# Patient Record
Sex: Female | Born: 1955
Health system: Southern US, Community
[De-identification: ages and names within clinical notes are randomized; demographics above are authoritative.]

## PROBLEM LIST (undated history)

## (undated) DIAGNOSIS — E7800 Pure hypercholesterolemia, unspecified: Secondary | ICD-10-CM

## (undated) DIAGNOSIS — I10 Essential (primary) hypertension: Secondary | ICD-10-CM

## (undated) DIAGNOSIS — T7840XA Allergy, unspecified, initial encounter: Secondary | ICD-10-CM

## (undated) DIAGNOSIS — J302 Other seasonal allergic rhinitis: Secondary | ICD-10-CM

## (undated) DIAGNOSIS — L309 Dermatitis, unspecified: Secondary | ICD-10-CM

## (undated) HISTORY — PX: INNER EAR SURGERY: SHX679

## (undated) HISTORY — PX: NO PAST SURGERIES: SHX2092

## (undated) HISTORY — DX: Pure hypercholesterolemia, unspecified: E78.00

## (undated) HISTORY — DX: Allergy, unspecified, initial encounter: T78.40XA

## (undated) HISTORY — DX: Essential (primary) hypertension: I10

## (undated) HISTORY — DX: Dermatitis, unspecified: L30.9

## (undated) HISTORY — DX: Other seasonal allergic rhinitis: J30.2

---

## 2015-02-01 ENCOUNTER — Ambulatory Visit (INDEPENDENT_AMBULATORY_CARE_PROVIDER_SITE_OTHER): Payer: 59 | Admitting: Nurse Practitioner

## 2015-02-01 ENCOUNTER — Telehealth: Payer: Self-pay | Admitting: Nurse Practitioner

## 2015-02-01 ENCOUNTER — Encounter: Payer: Self-pay | Admitting: Nurse Practitioner

## 2015-02-01 VITALS — BP 137/82 | HR 72 | Temp 98.0°F | Ht 59.0 in | Wt 157.0 lb

## 2015-02-01 DIAGNOSIS — I10 Essential (primary) hypertension: Secondary | ICD-10-CM

## 2015-02-01 MED ORDER — LISINOPRIL-HYDROCHLOROTHIAZIDE 10-12.5 MG PO TABS: 1.0000 | ORAL_TABLET | Freq: Every day | ORAL | Status: DC

## 2015-02-01 NOTE — Patient Instructions (Signed)
Continue blood pressure medicine. Blood pressure goal is under 120/80. Weight loss will lower blood pressure.  Weight goal is 120 lbs. Start walking 30 minutes every day. Stop eating sugar-no candy, cookies, cake, sweet tea, juice, soda. Eat fresh fruit instead.  Please mail lab results. See me in 3 months to check weight & blood pressure. You should be able to lose at least 10 pounds when I see you again. Also, you are due for Pap smear & tetanus vaccine.   Very nice to meet you!

## 2015-02-01 NOTE — Telephone Encounter (Signed)
emmi emailed °

## 2015-02-01 NOTE — Progress Notes (Signed)
Pre visit review using our clinic review tool, if applicable. No additional management support is needed unless otherwise documented below in the visit note. 

## 2015-02-05 ENCOUNTER — Encounter: Payer: Self-pay | Admitting: Nurse Practitioner

## 2015-02-05 NOTE — Progress Notes (Signed)
Subjective:    Gloria [name] is a 59 y.o. female who presents to establish care & discuss elevated blood pressure. She is from [place] She is currently treated with lisinopril/HCTZ 10/12.5 mg qd. She is tolerating w/out SE. Cardiac symptoms: none. Patient denies: chest pain, irregular heart beat and lower extremity edema. Cardiovascular risk factors: hypertension, obesity (BMI >= 30 kg/m2) and sedentary lifestyle. Use of agents associated with hypertension: none. History of target organ damage: none. She recently had "[facility]". She will mail results.  The following portions of the patient's history were reviewed and updated as appropriate: allergies, current medications, past family history, past medical history, past social history, past surgical history and problem list.  Review of Systems Pertinent items are noted in HPI.   Objective:    BP 137/82 mmHg  Pulse 72  Temp(Src) 98 F (36.7 C) (Temporal)  Ht 4\' 11"  (1.499 m)  Wt 157 lb (71.215 kg)  BMI 31.69 kg/m2  SpO2 98% General appearance: alert, cooperative, appears stated age and no distress Head: Normocephalic, without obvious abnormality, atraumatic Eyes: negative findings: lids and lashes normal and conjunctivae and sclerae normal Neck: no adenopathy, no carotid bruit, supple, symmetrical, trachea midline and thyroid not enlarged, symmetric, no tenderness/mass/nodules Lungs: clear to auscultation bilaterally Heart: regular rate and rhythm, S1, S2 normal, no murmur, click, rub or gallop Neurologic: Grossly normal    Assessment:Plan     1. Essential hypertension, benign Continue current med Fair control Encourage weight loss. Activity & diet changes recommended - lisinopril-hydrochlorothiazide (PRINZIDE,ZESTORETIC) 10-12.5 MG per tablet; Take 1 tablet by mouth daily. TAKE ONE TABLET BY MOUTH ONE TIME DAILY  Dispense: 90 tablet; Refill: 1 [facility] results to be mailed. F/u 3 mos-needs pap

## 2015-05-01 ENCOUNTER — Encounter: Payer: Self-pay | Admitting: Nurse Practitioner

## 2015-05-01 DIAGNOSIS — Z0000 Encounter for general adult medical examination without abnormal findings: Secondary | ICD-10-CM | POA: Insufficient documentation

## 2015-05-03 ENCOUNTER — Ambulatory Visit: Payer: 59 | Admitting: Nurse Practitioner

## 2015-05-03 ENCOUNTER — Encounter: Payer: Self-pay | Admitting: Nurse Practitioner

## 2015-05-03 ENCOUNTER — Ambulatory Visit (INDEPENDENT_AMBULATORY_CARE_PROVIDER_SITE_OTHER): Payer: 59 | Admitting: Nurse Practitioner

## 2015-05-03 VITALS — BP 126/78 | HR 74 | Temp 98.0°F | Resp 16 | Ht 59.0 in | Wt 156.0 lb

## 2015-05-03 DIAGNOSIS — E669 Obesity, unspecified: Secondary | ICD-10-CM | POA: Insufficient documentation

## 2015-05-03 DIAGNOSIS — Z6831 Body mass index (BMI) 31.0-31.9, adult: Secondary | ICD-10-CM

## 2015-05-03 DIAGNOSIS — I10 Essential (primary) hypertension: Secondary | ICD-10-CM | POA: Insufficient documentation

## 2015-05-03 DIAGNOSIS — E559 Vitamin D deficiency, unspecified: Secondary | ICD-10-CM | POA: Insufficient documentation

## 2015-05-03 DIAGNOSIS — Z114 Encounter for screening for human immunodeficiency virus [HIV]: Secondary | ICD-10-CM | POA: Diagnosis not present

## 2015-05-03 NOTE — Progress Notes (Signed)
Subjective:    Patient here for follow-up of elevated blood pressure.  She is exercising and is not adherent to a low-salt diet.  Blood pressure is well controlled at home. Cardiac symptoms: none. Patient denies: chest pressure/discomfort, irregular heart beat and lower extremity edema. Cardiovascular risk factors: hypertension and obesity (BMI >= 30 kg/m2). Use of agents associated with hypertension: none. History of target organ damage: none.  Reviewed preventive care: needs MMG & pap. Pt refuses today. Plan on returning for labs-she will mail labs from "[facility]".   The following portions of the patient's history were reviewed and updated as appropriate: allergies, current medications, past medical history, past social history, past surgical history and problem list.  Review of Systems Pertinent items are noted in HPI.     Objective:    BP 126/78 mmHg  Pulse 74  Temp(Src) 98 F (36.7 C) (Temporal)  Resp 16  Ht 4\' 11"  (1.499 m)  Wt 156 lb (70.761 kg)  BMI 31.49 kg/m2  SpO2 95% General appearance: alert, cooperative, appears stated age and no distress Eyes: negative findings: lids and lashes normal and conjunctivae and sclerae normal Lungs: clear to auscultation bilaterally Heart: regular rate and rhythm, S1, S2 normal, no murmur, click, rub or gallop Neurologic: Grossly normal    Assessment:Plan  1. BMI 31.0-31.9,adult Increase exercise to 5 times week. Eliminate soda. - Lipid panel; Future - Hemoglobin A1c; Future - TSH; Future  2. Vitamin D deficiency - Vit D  25 hydroxy (rtn osteoporosis monitoring); Future  3. Essential hypertension Stable Continue current meds - CBC with Differential/Platelet; Future - Comprehensive metabolic panel; Future - Microalbumin / creatinine urine ratio; Future  4. Screening for HIV (human immunodeficiency virus) - HIV antibody; Future  F/u 2 wks for fasting labs, needs pap & mmg

## 2015-05-03 NOTE — Progress Notes (Signed)
Pre visit review using our clinic review tool, if applicable. No additional management support is needed unless otherwise documented below in the visit note. 

## 2015-05-03 NOTE — Patient Instructions (Signed)
Please return for fasting labs-no breakfast. Coff is OK if no milk or sugar.  Please get mammogram and return for pap smear.  Eliminate soda. Increase Exercise to 5 days week for best health.

## 2015-05-15 ENCOUNTER — Telehealth: Payer: Self-pay | Admitting: Nurse Practitioner

## 2015-05-15 ENCOUNTER — Encounter: Payer: Self-pay | Admitting: Family Medicine

## 2015-05-15 NOTE — Telephone Encounter (Signed)
pls call pt: Advise I reviewed tests she had through [facility] screening. I recommend she come in for labs-all orders placed. She may come at her convenience & should fast.

## 2015-05-15 NOTE — Telephone Encounter (Signed)
Pt's phone number is d/c.   Sent unable to contact letter to patient.

## 2015-05-18 ENCOUNTER — Telehealth: Payer: Self-pay | Admitting: Nurse Practitioner

## 2015-05-18 NOTE — Telephone Encounter (Signed)
Patient's phone # has been updated. Patient's husband does not want to pay for patient to have bloodwork. He will call back if they change their mind.

## 2015-08-17 ENCOUNTER — Telehealth: Payer: Self-pay | Admitting: Nurse Practitioner

## 2015-08-17 NOTE — Telephone Encounter (Signed)
Pt states her B/P is low now and she wants to know if she still needs to continue her B/P meds? Please call at [phone]

## 2015-08-17 NOTE — Telephone Encounter (Signed)
Spoke to patient.  She was to f/u in July and never did so.  Pt agreed to schedule appointment and will be seen by Dr. Raoul Pitch 08/20/15.

## 2015-08-20 ENCOUNTER — Encounter: Payer: Self-pay | Admitting: Family Medicine

## 2015-08-20 ENCOUNTER — Ambulatory Visit (INDEPENDENT_AMBULATORY_CARE_PROVIDER_SITE_OTHER): Payer: Managed Care, Other (non HMO) | Admitting: Family Medicine

## 2015-08-20 VITALS — BP 156/85 | HR 62 | Temp 97.9°F | Wt 158.8 lb

## 2015-08-20 DIAGNOSIS — I10 Essential (primary) hypertension: Secondary | ICD-10-CM | POA: Diagnosis not present

## 2015-08-20 MED ORDER — LISINOPRIL-HYDROCHLOROTHIAZIDE 10-12.5 MG PO TABS: 1.0000 | ORAL_TABLET | Freq: Every day | ORAL | Status: DC

## 2015-08-20 NOTE — Progress Notes (Signed)
   Subjective:    Patient ID: Gloria [name], female    DOB: Dec 17, 1955, 59 y.o.   MRN: [id number]  HPI  Hypertension: Patient presents for acute office visit today to follow-up on her hypertension. She states that she has been checking her blood pressure at work and at the [facility] and has noticed "low blood pressures "that she states are in the 389H systolic and 73S to 70 diastolic. She denies any dizziness, chest pain, lower extremity edema, syncope, visual changes or headache. Patient walks every day, watches the salt content in her diet. She does take a daily baby aspirin. No cholesterol screening on file, patient states she gets these through her employment screenings. She reports they have been normal.  Past Medical History  Diagnosis Date  . Seasonal allergies   . Hypertension   . High cholesterol   . Allergy    No Known Allergies History reviewed. No pertinent past surgical history. Social History   Social History  . Marital Status: Married    Spouse Name: N/A  . Number of Children: 1  . Years of Education: 4 yr [place]   Occupational History  . 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 Smoking status: Never Smoker   . Smokeless tobacco: Not on file  . Alcohol Use: No  . Drug Use: No  . Sexual Activity: Yes    Birth Control/ Protection: Post-menopausal   Other Topics Concern  . Not on file   Social History Narrative   Ms Ganci is married. Recently moved from [place]. She lives with husband & adopted daughter. She works FT at [employer].     Review of Systems Negative, with the exception of above mentioned in HPI     Objective:   Physical Exam BP 156/85 mmHg  Pulse 62  Temp(Src) 97.9 F (36.6 C) (Oral)  Wt 158 lb 12.8 oz (72.031 kg)  SpO2 98% Gen: Afebrile. No acute distress. Nontoxic in appearance, well-developed, well-nourished female. Pleasant. HENT: AT. [place].  MMM.  Eyes:Pupils Equal Round Reactive to light, Extraocular movements  intact,  Conjunctiva without redness, discharge or icterus. CV: RRR no murmur appreciated, no edema, +2/4 P posterior tibialis pulses Chest: CTAB, no wheeze or crackles.      Assessment & Plan:  1. Essential hypertension, benign - Discussed normal blood pressure with patient today, advised her to continue monitoring her blood pressure in the outpatient setting, however normal blood pressure would be anything between 105 - 139 and below 90 diastolic. Patient encouraged to exercise daily, to continue watching salt content in her diet. Patient educated on signs of hypertension. She is to continue monitoring her blood pressures, if she notices consistent abnormal results she is to follow-up sooner, otherwise she can follow-up in 4 months. - lisinopril-hydrochlorothiazide (PRINZIDE,ZESTORETIC) 10-12.5 MG tablet; Take 1 tablet by mouth daily. TAKE ONE TABLET BY MOUTH ONE TIME DAILY  Dispense: 90 tablet; Refill: 1  - Patient encouraged to obtain records of her laboratory results and her immunization records from her employment. So that her health maintenance can be monitored more efficiently.

## 2015-08-20 NOTE — Patient Instructions (Addendum)
Hypertension Hypertension, commonly called high blood pressure, is when the force of blood pumping through your arteries is too strong. Your arteries are the blood vessels that carry blood from your heart throughout your body. A blood pressure reading consists of a higher number over a lower number, such as 110/72. The higher number (systolic) is the pressure inside your arteries when your heart pumps. The lower number (diastolic) is the pressure inside your arteries when your heart relaxes. Ideally you want your blood pressure below 120/80. Hypertension forces your heart to work harder to pump blood. Your arteries may become narrow or stiff. Having untreated or uncontrolled hypertension can cause heart attack, stroke, kidney disease, and other problems. RISK FACTORS Some risk factors for high blood pressure are controllable. Others are not.  Risk factors you cannot control include:   Race. You may be at higher risk if you are African American.  Age. Risk increases with age.  Gender. Men are at higher risk than women before age 45 years. After age 65, women are at higher risk than men. Risk factors you can control include:  Not getting enough exercise or physical activity.  Being overweight.  Getting too much fat, sugar, calories, or salt in your diet.  Drinking too much alcohol. SIGNS AND SYMPTOMS Hypertension does not usually cause signs or symptoms. Extremely high blood pressure (hypertensive crisis) may cause headache, anxiety, shortness of breath, and nosebleed. DIAGNOSIS To check if you have hypertension, your health care provider will measure your blood pressure while you are seated, with your arm held at the level of your heart. It should be measured at least twice using the same arm. Certain conditions can cause a difference in blood pressure between your right and left arms. A blood pressure reading that is higher than normal on one occasion does not mean that you need treatment. If  it is not clear whether you have high blood pressure, you may be asked to return on a different day to have your blood pressure checked again. Or, you may be asked to monitor your blood pressure at home for 1 or more weeks. TREATMENT Treating high blood pressure includes making lifestyle changes and possibly taking medicine. Living a healthy lifestyle can help lower high blood pressure. You may need to change some of your habits. Lifestyle changes may include:  Following the DASH diet. This diet is high in fruits, vegetables, and whole grains. It is low in salt, red meat, and added sugars.  Keep your sodium intake below 2,300 mg per day.  Getting at least 30-45 minutes of aerobic exercise at least 4 times per week.  Losing weight if necessary.  Not smoking.  Limiting alcoholic beverages.  Learning ways to reduce stress. Your health care provider may prescribe medicine if lifestyle changes are not enough to get your blood pressure under control, and if one of the following is true:  You are 18-59 years of age and your systolic blood pressure is above 140.  You are 60 years of age or older, and your systolic blood pressure is above 150.  Your diastolic blood pressure is above 90.  You have diabetes, and your systolic blood pressure is over 140 or your diastolic blood pressure is over 90.  You have kidney disease and your blood pressure is above 140/90.  You have heart disease and your blood pressure is above 140/90. Your personal target blood pressure may vary depending on your medical conditions, your age, and other factors. HOME CARE INSTRUCTIONS    Have your blood pressure rechecked as directed by your health care provider.   Take medicines only as directed by your health care provider. Follow the directions carefully. Blood pressure medicines must be taken as prescribed. The medicine does not work as well when you skip doses. Skipping doses also puts you at risk for  problems.  Do not smoke.   Monitor your blood pressure at home as directed by your health care provider. SEEK MEDICAL CARE IF:   You think you are having a reaction to medicines taken.  You have recurrent headaches or feel dizzy.  You have swelling in your ankles.  You have trouble with your vision. SEEK IMMEDIATE MEDICAL CARE IF:  You develop a severe headache or confusion.  You have unusual weakness, numbness, or feel faint.  You have severe chest or abdominal pain.  You vomit repeatedly.  You have trouble breathing. MAKE SURE YOU:   Understand these instructions.  Will watch your condition.  Will get help right away if you are not doing well or get worse.   This information is not intended to replace advice given to you by your health care provider. Make sure you discuss any questions you have with your health care provider.   Document Released: 10/20/2005 Document Revised: 03/06/2015 Document Reviewed: 08/12/2013 Elsevier Interactive Patient Education 2016 [place] Blood pressure medicine.  Normal Blood pressure for you should be between 105-140 on the top and lower than 90 on the bottom.  Continue to watch the salt in your diet and exercise.  It was a pleasure meeting you.  Please get [place] the records of your immunizations and labs.

## 2015-12-14 ENCOUNTER — Encounter: Payer: Self-pay | Admitting: Family Medicine

## 2015-12-14 ENCOUNTER — Ambulatory Visit (INDEPENDENT_AMBULATORY_CARE_PROVIDER_SITE_OTHER): Payer: Managed Care, Other (non HMO) | Admitting: Family Medicine

## 2015-12-14 VITALS — BP 120/80 | HR 61 | Temp 97.8°F | Resp 20 | Wt 155.8 lb

## 2015-12-14 DIAGNOSIS — I10 Essential (primary) hypertension: Secondary | ICD-10-CM

## 2015-12-14 DIAGNOSIS — Z1239 Encounter for other screening for malignant neoplasm of breast: Secondary | ICD-10-CM | POA: Insufficient documentation

## 2015-12-14 DIAGNOSIS — Z6831 Body mass index (BMI) 31.0-31.9, adult: Secondary | ICD-10-CM | POA: Diagnosis not present

## 2015-12-14 LAB — COMPREHENSIVE METABOLIC PANEL
ALT: 20 U/L (ref 6–29)
AST: 21 U/L (ref 10–35)
Albumin: 4.2 g/dL (ref 3.6–5.1)
Alkaline Phosphatase: 50 U/L (ref 33–130)
BUN: 12 mg/dL (ref 7–25)
CO2: 31 mmol/L (ref 20–31)
Calcium: 9.4 mg/dL (ref 8.6–10.4)
Chloride: 99 mmol/L (ref 98–110)
Creat: 0.63 mg/dL (ref 0.50–0.99)
GLUCOSE: 93 mg/dL (ref 65–99)
POTASSIUM: 4.7 mmol/L (ref 3.5–5.3)
Sodium: 135 mmol/L (ref 135–146)
Total Bilirubin: 0.5 mg/dL (ref 0.2–1.2)
Total Protein: 7 g/dL (ref 6.1–8.1)

## 2015-12-14 LAB — CBC WITH DIFFERENTIAL/PLATELET
BASOS ABS: 0.1 10*3/uL (ref 0.0–0.1)
Basophils Relative: 1 % (ref 0–1)
EOS ABS: 0.6 10*3/uL (ref 0.0–0.7)
EOS PCT: 9 % — AB (ref 0–5)
HCT: 38.6 % (ref 36.0–46.0)
Hemoglobin: 12.7 g/dL (ref 12.0–15.0)
Lymphocytes Relative: 27 % (ref 12–46)
Lymphs Abs: 1.9 10*3/uL (ref 0.7–4.0)
MCH: 29 pg (ref 26.0–34.0)
MCHC: 32.9 g/dL (ref 30.0–36.0)
MCV: 88.1 fL (ref 78.0–100.0)
MPV: 10.1 fL (ref 8.6–12.4)
Monocytes Absolute: 0.4 10*3/uL (ref 0.1–1.0)
Monocytes Relative: 6 % (ref 3–12)
Neutro Abs: 4 10*3/uL (ref 1.7–7.7)
Neutrophils Relative %: 57 % (ref 43–77)
PLATELETS: 269 10*3/uL (ref 150–400)
RBC: 4.38 MIL/uL (ref 3.87–5.11)
RDW: 13.1 % (ref 11.5–15.5)
WBC: 7.1 10*3/uL (ref 4.0–10.5)

## 2015-12-14 LAB — TSH: TSH: 1.19 mIU/L

## 2015-12-14 MED ORDER — LISINOPRIL-HYDROCHLOROTHIAZIDE 10-12.5 MG PO TABS: 1.0000 | ORAL_TABLET | Freq: Every day | ORAL | Status: DC

## 2015-12-14 NOTE — Patient Instructions (Signed)
Hypertension Hypertension, commonly called high blood pressure, is when the force of blood pumping through your arteries is too strong. Your arteries are the blood vessels that carry blood from your heart throughout your body. A blood pressure reading consists of a higher number over a lower number, such as 110/72. The higher number (systolic) is the pressure inside your arteries when your heart pumps. The lower number (diastolic) is the pressure inside your arteries when your heart relaxes. Ideally you want your blood pressure below 120/80. Hypertension forces your heart to work harder to pump blood. Your arteries may become narrow or stiff. Having untreated or uncontrolled hypertension can cause heart attack, stroke, kidney disease, and other problems. RISK FACTORS Some risk factors for high blood pressure are controllable. Others are not.  Risk factors you cannot control include:   Race. You may be at higher risk if you are African American.  Age. Risk increases with age.  Gender. Men are at higher risk than women before age 59 years. After age 96, women are at higher risk than men. Risk factors you can control include:  Not getting enough exercise or physical activity.  Being overweight.  Getting too much fat, sugar, calories, or salt in your diet.  Drinking too much alcohol. SIGNS AND SYMPTOMS Hypertension does not usually cause signs or symptoms. Extremely high blood pressure (hypertensive crisis) may cause headache, anxiety, shortness of breath, and nosebleed. DIAGNOSIS To check if you have hypertension, your health care provider will measure your blood pressure while you are seated, with your arm held at the level of your heart. It should be measured at least twice using the same arm. Certain conditions can cause a difference in blood pressure between your right and left arms. A blood pressure reading that is higher than normal on one occasion does not mean that you need treatment. If  it is not clear whether you have high blood pressure, you may be asked to return on a different day to have your blood pressure checked again. Or, you may be asked to monitor your blood pressure at home for 1 or more weeks. TREATMENT Treating high blood pressure includes making lifestyle changes and possibly taking medicine. Living a healthy lifestyle can help lower high blood pressure. You may need to change some of your habits. Lifestyle changes may include:  Following the DASH diet. This diet is high in fruits, vegetables, and whole grains. It is low in salt, red meat, and added sugars.  Keep your sodium intake below 2,300 mg per day.  Getting at least 30-45 minutes of aerobic exercise at least 4 times per week.  Losing weight if necessary.  Not smoking.  Limiting alcoholic beverages.  Learning ways to reduce stress. Your health care provider may prescribe medicine if lifestyle changes are not enough to get your blood pressure under control, and if one of the following is true:  You are 17-110 years of age and your systolic blood pressure is above 140.  You are 90 years of age or older, and your systolic blood pressure is above 150.  Your diastolic blood pressure is above 90.  You have diabetes, and your systolic blood pressure is over 160 or your diastolic blood pressure is over 90.  You have kidney disease and your blood pressure is above 140/90.  You have heart disease and your blood pressure is above 140/90. Your personal target blood pressure may vary depending on your medical conditions, your age, and other factors. HOME CARE INSTRUCTIONS  Have your blood pressure rechecked as directed by your health care provider.   Take medicines only as directed by your health care provider. Follow the directions carefully. Blood pressure medicines must be taken as prescribed. The medicine does not work as well when you skip doses. Skipping doses also puts you at risk for  problems.  Do not smoke.   Monitor your blood pressure at home as directed by your health care provider. SEEK MEDICAL CARE IF:   You think you are having a reaction to medicines taken.  You have recurrent headaches or feel dizzy.  You have swelling in your ankles.  You have trouble with your vision. SEEK IMMEDIATE MEDICAL CARE IF:  You develop a severe headache or confusion.  You have unusual weakness, numbness, or feel faint.  You have severe chest or abdominal pain.  You vomit repeatedly.  You have trouble breathing. MAKE SURE YOU:   Understand these instructions.  Will watch your condition.  Will get help right away if you are not doing well or get worse.   This information is not intended to replace advice given to you by your health care provider. Make sure you discuss any questions you have with your health care provider.   Document Released: 10/20/2005 Document Revised: 03/06/2015 Document Reviewed: 08/12/2013 Elsevier Interactive Patient Education 2016 [place] Maintenance, Female Adopting a healthy lifestyle and getting preventive care can go a long way to promote health and wellness. Talk with your health care provider about what schedule of regular examinations is right for you. This is a good chance for you to check in with your provider about disease prevention and staying healthy. In between checkups, there are plenty of things you can do on your own. Experts have done a lot of research about which lifestyle changes and preventive measures are most likely to keep you healthy. Ask your health care provider for more information. WEIGHT AND DIET  Eat a healthy diet  Be sure to include plenty of vegetables, fruits, low-fat dairy products, and lean protein.  Do not eat a lot of foods high in solid fats, added sugars, or salt.  Get regular exercise. This is one of the most important things you can do for your health.  Most adults should  exercise for at least 150 minutes each week. The exercise should increase your heart rate and make you sweat (moderate-intensity exercise).  Most adults should also do strengthening exercises at least twice a week. This is in addition to the moderate-intensity exercise.  Maintain a healthy weight  Body mass index (BMI) is a measurement that can be used to identify possible weight problems. It estimates body fat based on height and weight. Your health care provider can help determine your BMI and help you achieve or maintain a healthy weight.  For females 45 years of age and older:   A BMI below 18.5 is considered underweight.  A BMI of 18.5 to 24.9 is normal.  A BMI of 25 to 29.9 is considered overweight.  A BMI of 30 and above is considered obese.  Watch levels of cholesterol and blood lipids  You should start having your blood tested for lipids and cholesterol at 61 years of age, then have this test every 5 years.  You may need to have your cholesterol levels checked more often if:  Your lipid or cholesterol levels are high.  You are older than 61 years of age.  You are at high risk for heart  disease.  CANCER SCREENING   Lung Cancer  Lung cancer screening is recommended for adults 72-29 years old who are at high risk for lung cancer because of a history of smoking.  A yearly low-dose CT scan of the lungs is recommended for people who:  Currently smoke.  Have quit within the past 15 years.  Have at least a 30-pack-year history of smoking. A pack year is smoking an average of one pack of cigarettes a day for 1 year.  Yearly screening should continue until it has been 15 years since you quit.  Yearly screening should stop if you develop a health problem that would prevent you from having lung cancer treatment.  Breast Cancer  Practice breast self-awareness. This means understanding how your breasts normally appear and feel.  It also means doing regular breast  self-exams. Let your health care provider know about any changes, no matter how small.  If you are in your 20s or 30s, you should have a clinical breast exam (CBE) by a health care provider every 1-3 years as part of a regular health exam.  If you are 32 or older, have a CBE every year. Also consider having a breast X-ray (mammogram) every year.  If you have a family history of breast cancer, talk to your health care provider about genetic screening.  If you are at high risk for breast cancer, talk to your health care provider about having an MRI and a mammogram every year.  Breast cancer gene (BRCA) assessment is recommended for women who have family members with BRCA-related cancers. BRCA-related cancers include:  Breast.  Ovarian.  Tubal.  Peritoneal cancers.  Results of the assessment will determine the need for genetic counseling and BRCA1 and BRCA2 testing. Cervical Cancer Your health care provider may recommend that you be screened regularly for cancer of the pelvic organs (ovaries, uterus, and vagina). This screening involves a pelvic examination, including checking for microscopic changes to the surface of your cervix (Pap test). You may be encouraged to have this screening done every 3 years, beginning at age 98.  For women ages 71-65, health care providers may recommend pelvic exams and Pap testing every 3 years, or they may recommend the Pap and pelvic exam, combined with testing for human papilloma virus (HPV), every 5 years. Some types of HPV increase your risk of cervical cancer. Testing for HPV may also be done on women of any age with unclear Pap test results.  Other health care providers may not recommend any screening for nonpregnant women who are considered low risk for pelvic cancer and who do not have symptoms. Ask your health care provider if a screening pelvic exam is right for you.  If you have had past treatment for cervical cancer or a condition that could lead  to cancer, you need Pap tests and screening for cancer for at least 20 years after your treatment. If Pap tests have been discontinued, your risk factors (such as having a new sexual partner) need to be reassessed to determine if screening should resume. Some women have medical problems that increase the chance of getting cervical cancer. In these cases, your health care provider may recommend more frequent screening and Pap tests. Colorectal Cancer  This type of cancer can be detected and often prevented.  Routine colorectal cancer screening usually begins at 61 years of age and continues through 61 years of age.  Your health care provider may recommend screening at an earlier age if you have  risk factors for colon cancer.  Your health care provider may also recommend using home test kits to check for hidden blood in the stool.  A small camera at the end of a tube can be used to examine your colon directly (sigmoidoscopy or colonoscopy). This is done to check for the earliest forms of colorectal cancer.  Routine screening usually begins at age 34.  Direct examination of the colon should be repeated every 5-10 years through 61 years of age. However, you may need to be screened more often if early forms of precancerous polyps or small growths are found. Skin Cancer  Check your skin from head to toe regularly.  Tell your health care provider about any new moles or changes in moles, especially if there is a change in a mole's shape or color.  Also tell your health care provider if you have a mole that is larger than the size of a pencil eraser.  Always use sunscreen. Apply sunscreen liberally and repeatedly throughout the day.  Protect yourself by wearing long sleeves, pants, a wide-brimmed hat, and sunglasses whenever you are outside. HEART DISEASE, DIABETES, AND HIGH BLOOD PRESSURE   High blood pressure causes heart disease and increases the risk of stroke. High blood pressure is more  likely to develop in:  People who have blood pressure in the high end of the normal range (130-139/85-89 mm Hg).  People who are overweight or obese.  People who are African American.  If you are 86-61 years of age, have your blood pressure checked every 3-5 years. If you are 66 years of age or older, have your blood pressure checked every year. You should have your blood pressure measured twice--once when you are at a hospital or clinic, and once when you are not at a hospital or clinic. Record the average of the two measurements. To check your blood pressure when you are not at a hospital or clinic, you can use:  An automated blood pressure machine at a pharmacy.  A home blood pressure monitor.  If you are between 34 years and 65 years old, ask your health care provider if you should take aspirin to prevent strokes.  Have regular diabetes screenings. This involves taking a blood sample to check your fasting blood sugar level.  If you are at a normal weight and have a low risk for diabetes, have this test once every three years after 61 years of age.  If you are overweight and have a high risk for diabetes, consider being tested at a younger age or more often. PREVENTING INFECTION  Hepatitis B  If you have a higher risk for hepatitis B, you should be screened for this virus. You are considered at high risk for hepatitis B if:  You were born in a country where hepatitis B is common. Ask your health care provider which countries are considered high risk.  Your parents were born in a high-risk country, and you have not been immunized against hepatitis B (hepatitis B vaccine).  You have HIV or AIDS.  You use needles to inject street drugs.  You live with someone who has hepatitis B.  You have had sex with someone who has hepatitis B.  You get hemodialysis treatment.  You take certain medicines for conditions, including cancer, organ transplantation, and autoimmune  conditions. Hepatitis C  Blood testing is recommended for:  Everyone born from [birth year] through 1966.  Anyone with known risk factors for hepatitis C. Sexually transmitted infections (STIs)  You should be screened for sexually transmitted infections (STIs) including gonorrhea and chlamydia if:  You are sexually active and are younger than 61 years of age.  You are older than 61 years of age and your health care provider tells you that you are at risk for this type of infection.  Your sexual activity has changed since you were last screened and you are at an increased risk for chlamydia or gonorrhea. Ask your health care provider if you are at risk.  If you do not have HIV, but are at risk, it may be recommended that you take a prescription medicine daily to prevent HIV infection. This is called pre-exposure prophylaxis (PrEP). You are considered at risk if:  You are sexually active and do not regularly use condoms or know the HIV status of your partner(s).  You take drugs by injection.  You are sexually active with a partner who has HIV. Talk with your health care provider about whether you are at high risk of being infected with HIV. If you choose to begin PrEP, you should first be tested for HIV. You should then be tested every 3 months for as long as you are taking PrEP.  PREGNANCY   If you are premenopausal and you may become pregnant, ask your health care provider about preconception counseling.  If you may become pregnant, take 400 to 800 micrograms (mcg) of folic acid every day.  If you want to prevent pregnancy, talk to your health care provider about birth control (contraception). OSTEOPOROSIS AND MENOPAUSE   Osteoporosis is a disease in which the bones lose minerals and strength with aging. This can result in serious bone fractures. Your risk for osteoporosis can be identified using a bone density scan.  If you are 72 years of age or older, or if you are at risk for  osteoporosis and fractures, ask your health care provider if you should be screened.  Ask your health care provider whether you should take a calcium or vitamin D supplement to lower your risk for osteoporosis.  Menopause may have certain physical symptoms and risks.  Hormone replacement therapy may reduce some of these symptoms and risks. Talk to your health care provider about whether hormone replacement therapy is right for you.  HOME CARE INSTRUCTIONS   Schedule regular health, dental, and eye exams.  Stay current with your immunizations.   Do not use any tobacco products including cigarettes, chewing tobacco, or electronic cigarettes.  If you are pregnant, do not drink alcohol.  If you are breastfeeding, limit how much and how often you drink alcohol.  Limit alcohol intake to no more than 1 drink per day for nonpregnant women. One drink equals 12 ounces of beer, 5 ounces of wine, or 1 ounces of hard liquor.  Do not use street drugs.  Do not share needles.  Ask your health care provider for help if you need support or information about quitting drugs.  Tell your health care provider if you often feel depressed.  Tell your health care provider if you have ever been abused or do not feel safe at home.   This information is not intended to replace advice given to you by your health care provider. Make sure you discuss any questions you have with your health care provider.   Document Released: 05/05/2011 Document Revised: 11/10/2014 Document Reviewed: 09/21/2013 Elsevier Interactive Patient Education [employer].

## 2015-12-14 NOTE — Progress Notes (Signed)
Patient ID: Gloria [name], female   DOB: 1956/02/22, 60 y.o.   MRN: [id number]   Subjective:    Patient ID: Gloria [name], female    DOB: 07/30/1956, 60 y.o.   MRN: [id number]  HPI  Hypertension: Patient presents for routine follow-up  today for hypertension. She denies any dizziness, chest pain, lower extremity edema, syncope, visual changes or headache. She reports compliance with lisinopril/HCTZ 10-12.5. She states she did forget to take her medication today. She is needing refills soon. She tries to eat a low-salt diet. She does not exercise routinely. She states she works at term, this makes it difficult to find time to exercise. She states her blood pressures have been within range in the outpatient setting.  Healthcare maintenance: Prior visits patient has repeatedly stated that she has had lab work and immunizations through her employer. She has never provided records, despite being asked multiple occasions. Patient has continued to decline colon cancer screening. She is amendable to scheduling her mammogram, which will be due in March. He follows with gynecology for Pap smears/pelvic.  Past Medical History  Diagnosis Date  . Seasonal allergies   . Hypertension   . High cholesterol   . Allergy    No Known Allergies Past Surgical History  Procedure Laterality Date  . No past surgeries     Social History   Social History  . Marital Status: Married    Spouse Name: N/A  . Number of Children: 1  . Years of Education: 4 yr [place]   Occupational History  . 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 Smoking status: Never Smoker   . Smokeless tobacco: Never Used  . Alcohol Use: No  . Drug Use: No  . Sexual Activity: Yes    Birth Control/ Protection: Post-menopausal   Other Topics Concern  . Not on file   Social History Narrative   Ms Rigdon is married.  She lives with husband & adopted daughter. She works FT at [employer].   Smoke detector in  the home, wears her seatbelt.        Review of Systems Negative, with the exception of above mentioned in HPI     Objective:   Physical Exam BP 120/80 mmHg  Pulse 61  Temp(Src) 97.8 F (36.6 C) (Oral)  Resp 20  Wt 155 lb 12 oz (70.648 kg)  SpO2 98% Gen: Afebrile. No acute distress. Nontoxic in appearance, well-developed, well-nourished female. Pleasant. Quiet HENT: AT. [place].  MMM.  Eyes:Pupils Equal Round Reactive to light, Extraocular movements intact,  Conjunctiva without redness, discharge or icterus. CV: RRR no murmur appreciated, no edema, +2/4 P posterior tibialis pulses Chest: CTAB, no wheeze or crackles.  Abdomen: Soft, nontender, nondistended, obese. No masses palpated. No hepatosplenomegaly. Neuro: Normal gait. Alert, oriented 3, [place], EOMI.     Assessment & Plan:   Essential hypertension, benign - Stable, continue current regimen, refills today - Continue low salt diet, attempt to exercise greater than 150 minutes a week. - lisinopril-hydrochlorothiazide (PRINZIDE,ZESTORETIC) 10-12.5 MG tablet; Take 1 tablet by mouth daily. TAKE ONE TABLET BY MOUTH ONE TIME DAILY  Dispense: 90 tablet; Refill: 1 - Continue daily aspirin - CBC w/Diff - Comp Met (CMET) - Lipid panel; Future--> patient to return for lab appointment only for fasting lipid panel.  BMI 31.0-31.9,adult - HgB A1c - TSH - Encourage greater then 150 minutes of exercise and dietary modifications.  Breast cancer screening - [facility] Digital  Screening; Future  Six-month follow-up on hypertension

## 2015-12-15 LAB — HEMOGLOBIN A1C
Hgb A1c MFr Bld: 6.1 % — ABNORMAL HIGH (ref <5.7)
Mean Plasma Glucose: 128 mg/dL — ABNORMAL HIGH (ref <117)

## 2015-12-17 ENCOUNTER — Telehealth: Payer: Self-pay | Admitting: Family Medicine

## 2015-12-17 NOTE — Telephone Encounter (Signed)
Left message for patient to return call to review labs and instructions. 

## 2015-12-17 NOTE — Telephone Encounter (Signed)
Please call pt: - her lab results indicate prediabetic range with an A1c of 6.1 (glucose 128). She will need repeat a1c with appt in 3 months, a1c prior to rooming  - Please also remind her to have her lipids drawn fasting. This is a future order since she was not fasting that morning.  - all other lab work looks normal.

## 2016-01-18 ENCOUNTER — Ambulatory Visit (INDEPENDENT_AMBULATORY_CARE_PROVIDER_SITE_OTHER): Payer: Managed Care, Other (non HMO) | Admitting: Family Medicine

## 2016-01-18 ENCOUNTER — Encounter: Payer: Self-pay | Admitting: Family Medicine

## 2016-01-18 VITALS — BP 129/80 | HR 68 | Temp 98.0°F | Resp 20 | Wt 154.8 lb

## 2016-01-18 DIAGNOSIS — L309 Dermatitis, unspecified: Secondary | ICD-10-CM

## 2016-01-18 DIAGNOSIS — N61 Mastitis without abscess: Secondary | ICD-10-CM | POA: Diagnosis not present

## 2016-01-18 HISTORY — DX: Dermatitis, unspecified: L30.9

## 2016-01-18 MED ORDER — CLOTRIMAZOLE 1 % EX CREA: TOPICAL_CREAM | CUTANEOUS | Status: DC

## 2016-01-18 NOTE — Patient Instructions (Signed)
I will call in a cream for you to use on the area.   We need to get a mammogram within a week for diagnosis. If mammogram  is normal, I will still need to follow close and we will likely need to refer you to a specialist.  Changes like this can be breast cancer, so we need to make certain to be follow through with work up.

## 2016-01-18 NOTE — Progress Notes (Signed)
Patient ID: Gloria [name], female   DOB: 1956-07-18, 60 y.o.   MRN: [id number]    Gloria [name] , [birth year], 60 y.o., female MRN: [id number]  CC: rt breast abrasion Subjective: Pt presents for an acute OV with complaints of Rt breast/nipple abrasion of unknown duration "awhile", and worsening/spreading area causing her concern for infection. Patient states her husband told her to make an appointment because it is getting worse. She denies any trauma to the area that she is aware.She does admit to discoloration, thicker skin, dry/flaky area that itches. She has been putting a moisturizer lotion over the area for the last few weeks. She denies any other areas of skin irritation, changes in soap, detergents, new bra etc.   Patient denies drainage, redness, fever or chills nipple discharge or breast mass. She denies family history of breast cancer. She is a prediabetic and currently not on medications for diabetes.  She is postmenopausal.     No Known Allergies Social History  Substance Use Topics  . Smoking status: Never Smoker   . Smokeless tobacco: Never Used  . Alcohol Use: No   Past Medical History  Diagnosis Date  . Seasonal allergies   . Hypertension   . High cholesterol   . Allergy    Past Surgical History  Procedure Laterality Date  . No past surgeries     Family History  Problem Relation Age of Onset  . Heart disease Father   . Alcohol abuse Brother   . Kidney disease Brother      Medication List       This list is accurate as of: 01/18/16 11:29 AM.  Always use your most recent med list.               aspirin 81 MG chewable tablet  Chew 81 mg by mouth. Reported on 12/14/2015     lisinopril-hydrochlorothiazide 10-12.5 MG tablet  Commonly known as:  PRINZIDE,ZESTORETIC  Take 1 tablet by mouth daily. TAKE ONE TABLET BY MOUTH ONE TIME DAILY       ROS: Negative, with the exception of above mentioned in HPI  Objective:  BP 129/80 mmHg  Pulse 68  Temp(Src) 98 F (36.7  C)  Resp 20  Wt 154 lb 12 oz (70.194 kg)  SpO2 97% Body mass index is 31.24 kg/(m^2). Gen: Afebrile. No acute distress. Nontoxic in appearance, well developed, well nourished female. pleasant HENT: AT. [place].MMM, no oral lesions.  Eyes:Pupils Equal Round Reactive to light, Extraocular movements intact,  Conjunctiva without redness, discharge or icterus. Neck/lymph: Supple, no lymphadenopathy Skin: no (other than below) rashes, purpura or petechiae.  Breasts: left breast appears normal, right breast 2.5x 3 cm lateral areolar lesion, scaly, hyperpigmented, mild erythema at 9 o'clock position of areola. No tenderness on exam, no suspicious masses, no other skin changes, no nipple drainage.  Neuro:  PERLA. EOMi. Alert. Oriented x3   Assessment/Plan: Gloria [name] is a 60 y.o. female present for acute OV for  Nipple dermatitis/Nipple inflammation - Prediabetic, will attempt treatment with antifungal while waiting on Diag mammogram to be completed. Pt has been known not to follow through with some evaluations in the past, discussed with her in the detail this could be something as simple as a fungal infection vs a type of breast cancer.  - clotrimazole (CLOTRIMAZOLE AF) 1 % cream; Apply to affected area BID.  Dispense: 30 g; Refill: 0 - MM Digital Diagnostic Bilat; Future - consider surgical referral for biopsy after mammogram, topical treatment.  -  F/U 1 week   electronically signed by:  Howard Pouch, DO  [place]

## 2016-05-21 ENCOUNTER — Other Ambulatory Visit: Payer: Self-pay | Admitting: Obstetrics & Gynecology

## 2016-05-21 DIAGNOSIS — Z1231 Encounter for screening mammogram for malignant neoplasm of breast: Secondary | ICD-10-CM

## 2016-05-23 ENCOUNTER — Ambulatory Visit
Admission: RE | Admit: 2016-05-23 | Discharge: 2016-05-23 | Disposition: A | Discharge status: Home / Self Care | Payer: Managed Care, Other (non HMO) | Source: Ambulatory Visit | Attending: Obstetrics & Gynecology | Admitting: Obstetrics & Gynecology

## 2016-05-23 DIAGNOSIS — Z1231 Encounter for screening mammogram for malignant neoplasm of breast: Secondary | ICD-10-CM

## 2016-06-12 ENCOUNTER — Ambulatory Visit: Payer: Managed Care, Other (non HMO) | Admitting: Family Medicine

## 2016-06-23 ENCOUNTER — Ambulatory Visit (INDEPENDENT_AMBULATORY_CARE_PROVIDER_SITE_OTHER): Payer: Managed Care, Other (non HMO) | Admitting: Family Medicine

## 2016-06-23 ENCOUNTER — Encounter: Payer: Self-pay | Admitting: Family Medicine

## 2016-06-23 VITALS — BP 121/79 | HR 63 | Temp 97.9°F | Resp 20 | Ht 59.0 in | Wt 153.8 lb

## 2016-06-23 DIAGNOSIS — I10 Essential (primary) hypertension: Secondary | ICD-10-CM | POA: Diagnosis not present

## 2016-06-23 DIAGNOSIS — R7303 Prediabetes: Secondary | ICD-10-CM | POA: Insufficient documentation

## 2016-06-23 DIAGNOSIS — Z6831 Body mass index (BMI) 31.0-31.9, adult: Secondary | ICD-10-CM

## 2016-06-23 LAB — POCT GLYCOSYLATED HEMOGLOBIN (HGB A1C): Hemoglobin A1C: 5.7

## 2016-06-23 MED ORDER — LISINOPRIL-HYDROCHLOROTHIAZIDE 10-12.5 MG PO TABS: 1.0000 | ORAL_TABLET | Freq: Every day | ORAL | 1 refills | Status: DC

## 2016-06-23 NOTE — Progress Notes (Signed)
Patient ID: Gloria [name], female   DOB: [birth year], 60 y.o.   MRN: [id number]   Subjective:    Patient ID: Gloria [name], female    DOB: 02-06-1956, 60 y.o.   MRN: [id number]  HPI  Hypertension: Patient presents for routine follow-up  today for hypertension. She denies any dizziness, chest pain, lower extremity edema, syncope, visual changes or headache. She reports compliance with lisinopril/HCTZ 10-12.5. She states compliance with  Medication now.  She tries to eat a low-salt diet. She walking/exercise routinely. She states her blood pressures have been within range in the outpatient setting. She forgets sometimes to take meds, but remembers most days. She takes a baby ASA sometimes.   Prediabetes: last a1c 6.1 12/2015. She has not made many dietary changes, but continues to walk daily. She denies numbness or tingling in her ext or non-healing wounds. She does eat white rice for many meals, since most of her asian  dishes have rice involved.    Past Medical History:  Diagnosis Date  . Allergy   . High cholesterol   . Hypertension   . Seasonal allergies    No Known Allergies Past Surgical History:  Procedure Laterality Date  . NO PAST SURGERIES     Social History   Social History  . Marital status: Married    Spouse name: N/A  . Number of children: 1  . Years of education: 4 yr [place]   Occupational History  . 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 Smoking status: Never Smoker  . Smokeless tobacco: Never Used  . Alcohol use No  . Drug use: No  . Sexual activity: Yes    Birth control/ protection: Post-menopausal   Other Topics Concern  . Not on file   Social History Narrative   Gloria [name] is married.  She lives with husband & adopted daughter. She works FT at [employer].   Smoke detector in the home, wears her seatbelt.        Review of Systems Negative, with the exception of above mentioned in HPI     Objective:   Physical  Exam BP 121/79 (BP Location: Right Arm, Patient Position: Sitting, Cuff Size: Large)   Pulse 63   Temp 97.9 F (36.6 C) (Oral)   Resp 20   Ht 4\' 11"  (1.499 m)   Wt 153 lb 12.8 oz (69.8 kg)   SpO2 95%   BMI 31.06 kg/m  Gen: Afebrile. No acute distress. Nontoxic in appearance, well-developed, well-nourished female. Pleasant.  HENT: AT. [place].  MMM.  Eyes:Pupils Equal Round Reactive to light, Extraocular movements intact,  Conjunctiva without redness, discharge or icterus. CV: RRR no murmur appreciated, no edema, +2/4 P posterior tibialis pulses Chest: CTAB, no wheeze or crackles.  Abdomen: Soft, nontender, nondistended, obese. No masses palpated. No hepatosplenomegaly. Neuro: Normal gait. Alert, oriented 3, [place], EOMI.     Assessment & Plan:   Essential hypertension, benign - Stable, continue current regimen, refills today - Continue low salt diet, continue exercise greater than 150 minutes a week. - lisinopril-hydrochlorothiazide (PRINZIDE,ZESTORETIC) 10-12.5 MG tablet; Take 1 tablet by mouth daily. TAKE ONE TABLET BY MOUTH ONE TIME DAILY  Dispense: 90 tablet; Refill: 1 - Continue daily aspirin - f/u every 6 mos.   Prediabetes/BMI > 30:  - Last a1c 6.1 --> rpt today 5.7. - Diet and exercise modifications discussed. Diabetes development discussed,as well as normal and abnormal ranges of A1c.  - F/U  6 months with hypertension f/u.   Electronically Signed by: Howard Pouch, DO [facility] [place]

## 2016-06-23 NOTE — Patient Instructions (Signed)
Prediabetes Eating Plan Prediabetes--also called impaired glucose tolerance or impaired fasting glucose--is a condition that causes blood sugar (blood glucose) levels to be higher than normal. Following a healthy diet can help to keep prediabetes under control. It can also help to lower the risk of type 2 diabetes and heart disease, which are increased in people who have prediabetes. Along with regular exercise, a healthy diet:  Promotes weight loss.  Helps to control blood sugar levels.  Helps to improve the way that the body uses insulin. WHAT DO I NEED TO KNOW ABOUT THIS EATING PLAN?  Use the glycemic index (GI) to plan your meals. The index tells you how quickly a food will raise your blood sugar. Choose low-GI foods. These foods take a longer time to raise blood sugar.  Pay close attention to the amount of carbohydrates in the food that you eat. Carbohydrates increase blood sugar levels.  Keep track of how many calories you take in. Eating the right amount of calories will help you to achieve a healthy weight. Losing about 7 percent of your starting weight can help to prevent type 2 diabetes.  You may want to follow a Mediterranean diet. This diet includes a lot of vegetables, lean meats or fish, whole grains, fruits, and healthy oils and fats. WHAT FOODS CAN I EAT? Grains Whole grains, such as whole-wheat or whole-grain breads, crackers, cereals, and pasta. Unsweetened oatmeal. Bulgur. Barley. Quinoa. Brown rice. Corn or whole-wheat flour tortillas or taco shells. Vegetables Lettuce. Spinach. Peas. Beets. Cauliflower. Cabbage. Broccoli. Carrots. Tomatoes. Squash. Eggplant. Herbs. Peppers. Onions. Cucumbers. Brussels sprouts. Fruits Berries. Bananas. Apples. Oranges. Grapes. Papaya. Mango. Pomegranate. Kiwi. Grapefruit. Cherries. Meats and Other Protein Sources Seafood. Lean meats, such as chicken and turkey or lean cuts of pork and beef. Tofu. Eggs. Nuts. Beans. Dairy Low-fat or  fat-free dairy products, such as yogurt, cottage cheese, and cheese. Beverages Water. Tea. Coffee. Sugar-free or diet soda. Seltzer water. Milk. Milk alternatives, such as soy or almond milk. Condiments Mustard. Relish. Low-fat, low-sugar ketchup. Low-fat, low-sugar barbecue sauce. Low-fat or fat-free mayonnaise. Sweets and Desserts Sugar-free or low-fat pudding. Sugar-free or low-fat ice cream and other frozen treats. Fats and Oils Avocado. Walnuts. Olive oil. The items listed above may not be a complete list of recommended foods or beverages. Contact your dietitian for more options.  WHAT FOODS ARE NOT RECOMMENDED? Grains Refined white flour and flour products, such as bread, pasta, snack foods, and cereals. Beverages Sweetened drinks, such as sweet iced tea and soda. Sweets and Desserts Baked goods, such as cake, cupcakes, pastries, cookies, and cheesecake. The items listed above may not be a complete list of foods and beverages to avoid. Contact your dietitian for more information.   This information is not intended to replace advice given to you by your health care provider. Make sure you discuss any questions you have with your health care provider.   Document Released: 03/06/2015 Document Reviewed: 03/06/2015 Elsevier Interactive Patient Education 2016 Elsevier Inc.  

## 2016-06-24 ENCOUNTER — Ambulatory Visit: Payer: Managed Care, Other (non HMO) | Admitting: Family Medicine

## 2016-12-19 ENCOUNTER — Ambulatory Visit (INDEPENDENT_AMBULATORY_CARE_PROVIDER_SITE_OTHER): Payer: BLUE CROSS/BLUE SHIELD | Admitting: Family Medicine

## 2016-12-19 ENCOUNTER — Encounter: Payer: Self-pay | Admitting: Family Medicine

## 2016-12-19 VITALS — BP 172/90 | HR 98 | Temp 98.3°F | Resp 20 | Wt 158.5 lb

## 2016-12-19 DIAGNOSIS — I10 Essential (primary) hypertension: Secondary | ICD-10-CM

## 2016-12-19 DIAGNOSIS — M25512 Pain in left shoulder: Secondary | ICD-10-CM | POA: Diagnosis not present

## 2016-12-19 DIAGNOSIS — R7303 Prediabetes: Secondary | ICD-10-CM | POA: Diagnosis not present

## 2016-12-19 LAB — POCT GLYCOSYLATED HEMOGLOBIN (HGB A1C): HEMOGLOBIN A1C: 5.9

## 2016-12-19 MED ORDER — LISINOPRIL-HYDROCHLOROTHIAZIDE 10-12.5 MG PO TABS: 1.0000 | ORAL_TABLET | Freq: Every day | ORAL | 1 refills | Status: DC

## 2016-12-19 NOTE — Patient Instructions (Signed)
Take medicine  EVERYDAY, even when fasting.  Your a1c today is ok at 5.9, but still a little elevated, keep watching your diet and exercise to help keep blood pressure controlled and blood sugar normal. Watch the sugar and carbohydrate (rice) consumption, decreasing this can make your blood sugars much better. Eat low sodium diet.  Exercise > 150 minutes a week.  Follow up every 6 months.

## 2016-12-19 NOTE — Progress Notes (Signed)
Patient ID: Gloria [name], female   DOB: [birth year], 61 y.o.   MRN: [id number]   Subjective:    Patient ID: Gloria [name], female    DOB: January 29, 1956, 61 y.o.   MRN: [id number]  Hypertension     Hypertension: Pt reports compliance with Lisinopril/HCTZ 10-12.5 mg daily.  Blood pressures ranges at home 1:30/80. Patient denies chest pain, shortness of breath or lower extremity edema. Pt takes a daily baby ASA. Pt is not prescribed statin. She does not exercise routinely. She does not watch her diet closely. She has not taken her medications today because she "fast every Friday ". She states she also received some bad news on the way into her appointment today. Her BMP has been normal.    Prediabetes: Patient has a history of elevated A1c. A1c trend 6.1--> 5.7-->5.9 today. She has not made any changes to her diet or exercise plan. Last office visit we discussed her cutting back on the white rice for the majority of her meals. She has not done this as of yet.  Left arm pain:  she has a new complaint today of left arm pain of 6 weeks' duration. He points to her left shoulder as location of discomfort. She states sometimes cause an achiness down her arm mostly with sleeping. He denies any numbness or tingling. She denies any injury. She states when she gets up and starts working or moving around the pain resolves. She's been using hot compresses and over-the-counter creams, which seemed to have some benefit for her. She has no known arthritis in other joints. She states she can move her arm around without difficulty. She denies any weakness.   Past Medical History:  Diagnosis Date  . Allergy   . High cholesterol   . Hypertension   . Seasonal allergies    No Known Allergies Past Surgical History:  Procedure Laterality Date  . NO PAST SURGERIES     Social History   Social History  . Marital status: Married    Spouse name: N/A  . Number of children: 1  . Years of education: 4 yr [place]    Occupational History  . 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 Smoking status: Never Smoker  . Smokeless tobacco: Never Used  . Alcohol use No  . Drug use: No  . Sexual activity: Yes    Birth control/ protection: Post-menopausal   Other Topics Concern  . Not on file   Social History Narrative   [name] is married.  She lives with husband & adopted daughter. She works FT at [employer].   Smoke detector in the home, wears her seatbelt.        Review of Systems Negative, with the exception of above mentioned in HPI     Objective:   Physical Exam BP (!) 172/90 (BP Location: Right Arm, Patient Position: Sitting, Cuff Size: Normal)   Pulse 98   Temp 98.3 F (36.8 C)   Resp 20   Wt 158 lb 8 oz (71.9 kg)   SpO2 98%   BMI 32.01 kg/m  Gen: Afebrile. No acute distress. Nontoxic in appearance, well-developed, well-nourished Asian female. HENT: AT. [place]. MMM.  Eyes:Pupils Equal Round Reactive to light, Extraocular movements intact,  Conjunctiva without redness, discharge or icterus. CV: RRR, no edema, +2/4 P posterior tibialis pulses Chest: CTAB, no wheeze or crackles Abd: Soft. NTND. BS present.  MSK: No erythema, no soft tissue swelling, no  tenderness to palpation left shoulder. Full range of motion. Neurovascular intact distally. Skin: no rashes, purpura or petechiae.  Neuro: Normal gait. PERLA. EOMi. Alert. Oriented.     Assessment & Plan:  Gloria [name] is a 61 y.o. female present for follow up on chronic medical conditions.   Essential hypertension, benign - Extremely elevated today. Patient is asymptomatic. She has not taken any of her blood pressure medications today because she states she is fasting. His custom with her that elicits for religious purposes, she should take her blood pressure medicine every day even when fasting. She is to go home and monitor her blood pressure after taking her medications, if elevated above 140/90  she needs to be seen immediately to discuss her medications. - Restart low-salt diet. Start exercise regimen, greater than 150 minutes a week. - Refills on lisinopril/HCTZ 10-12 0.5 mg daily provided today. -Continue baby aspirin daily. - Follow-up in 6 months, with BMP.   Prediabetes/BMI > 30:  -  A1c today 5.9. Discussed with her she needs to follow the dietary recommendations and increase her exercise in order to improve her A1c.  - continue to monitor every 6 months    Arthritis/left shoulder:  - Chest with patient this sounds like arthritic pain in her shoulder, improves with activity. Discussed creams, capsaicin, daily low-dose anti-inflammatory or Tylenol.   Electronically Signed by: Howard Pouch, DO [facility] [place]

## 2017-04-24 ENCOUNTER — Ambulatory Visit (HOSPITAL_BASED_OUTPATIENT_CLINIC_OR_DEPARTMENT_OTHER)
Admission: RE | Admit: 2017-04-24 | Discharge: 2017-04-24 | Disposition: A | Discharge status: Home / Self Care | Payer: BLUE CROSS/BLUE SHIELD | Source: Ambulatory Visit | Attending: Family Medicine | Admitting: Family Medicine

## 2017-04-24 ENCOUNTER — Ambulatory Visit (INDEPENDENT_AMBULATORY_CARE_PROVIDER_SITE_OTHER): Payer: BLUE CROSS/BLUE SHIELD | Admitting: Family Medicine

## 2017-04-24 ENCOUNTER — Encounter: Payer: Self-pay | Admitting: Family Medicine

## 2017-04-24 ENCOUNTER — Telehealth: Payer: Self-pay | Admitting: Family Medicine

## 2017-04-24 VITALS — BP 139/89 | HR 69 | Temp 98.5°F | Resp 20 | Wt 157.8 lb

## 2017-04-24 DIAGNOSIS — M1711 Unilateral primary osteoarthritis, right knee: Secondary | ICD-10-CM | POA: Insufficient documentation

## 2017-04-24 DIAGNOSIS — S8991XA Unspecified injury of right lower leg, initial encounter: Secondary | ICD-10-CM | POA: Insufficient documentation

## 2017-04-24 MED ORDER — MELOXICAM 15 MG PO TABS: 15.0000 mg | ORAL_TABLET | Freq: Every day | ORAL | 1 refills | Status: DC

## 2017-04-24 NOTE — Progress Notes (Signed)
Gloria [name] , 05-31-1956, 61 y.o., female MRN: [id number] Patient Care Team    Relationship Specialty Notifications Start End  Ma Hillock, DO PCP - General Family Medicine  08/20/15     Chief Complaint  Patient presents with  . Leg Pain    right maybe strain from exercise started 3 weeks ago     Subjective: Pt presents for an OV with complaints of right leg of 3 weeks duration. She reports she has recently started water aerobics. She does recall feeling a "pop " the day before onset of pain. She reports she thought that continuing exercising would make it eventually get better, but it has not. She denies any redness or swelling of her right knee. She has been able to ambulate on her knee immediately after injury and since the injury. She has never had an injury or surgery to her knee. The pain is worse when bending her knee. The pain is located medial, lateral and posterior knee. She reports it bothers her the most at night when it throbs. She occasionally will take 200-400 milligrams of Advil if it is hurting bad. She states most the time throughout the day, it's just an annoying pain.  No flowsheet data found.  No Known Allergies Social History  Substance Use Topics  . Smoking status: Never Smoker  . Smokeless tobacco: Never Used  . Alcohol use No   Past Medical History:  Diagnosis Date  . Allergy   . High cholesterol   . Hypertension   . Seasonal allergies    Past Surgical History:  Procedure Laterality Date  . NO PAST SURGERIES     Family History  Problem Relation Age of Onset  . Heart disease Father   . Alcohol abuse Brother   . Kidney disease Brother    Allergies as of 04/24/2017   No Known Allergies     Medication List       Accurate as of 04/24/17  2:08 PM. Always use your most recent med list.          aspirin 81 MG chewable tablet Chew 81 mg by mouth. Reported on 12/14/2015   lisinopril-hydrochlorothiazide 10-12.5 MG tablet Commonly known as:   PRINZIDE,ZESTORETIC Take 1 tablet by mouth daily. TAKE ONE TABLET BY MOUTH ONE TIME DAILY       All past medical history, surgical history, allergies, family history, immunizations andmedications were updated in the EMR today and reviewed under the history and medication portions of their EMR.     ROS: Negative, with the exception of above mentioned in HPI   Objective:  BP 139/89 (BP Location: Left Arm, Patient Position: Sitting, Cuff Size: Large)   Pulse 69   Temp 98.5 F (36.9 C)   Resp 20   Wt 157 lb 12 oz (71.6 kg)   SpO2 96%   BMI 31.86 kg/m  Body mass index is 31.86 kg/m. Gen: Afebrile. No acute distress. Nontoxic in appearance, well developed, well nourished.  HENT: AT. [place]. MMM, no oral lesions.  Eyes:Pupils Equal Round Reactive to light, Extraocular movements intact,  Conjunctiva without redness, discharge or icterus. MSK: No redness. No heat/warmth. No soft tissue swelling. Mild effusion. Tenderness to palpation along medial joint line. Mildly decreased flexion secondary to discomfort. No  ligamentous laxity noted.Special Test: Negative Lachman's, positive McMurray's test for pain. Neurovascularly intact distally.  Neuro: Normal gait. PERLA. EOMi. Alert. Oriented x3   No exam data present No results found. No results found for this  or any previous visit (from the past 24 hour(s)).  Assessment/Plan: Suhaylah Wampole is a 61 y.o. female present for OV for  Injury of right knee, initial encounter - Concern for possible medial meniscal injury. Patient was provided with decompression device today. Encouraged not to exercise her lower extremities, and refraining from twisting motion for 2-4 weeks. Use knee compression during waking hours. - NSAIDs--> prescribed meloxicam 15 mg daily with a meal. Encouraged her to take in the evening closer to bed with his back since nighttime seems to be her worst time. - Keep leg propped on pillows/elevated when able. - meloxicam (MOBIC) 15 MG  tablet; Take 1 tablet (15 mg total) by mouth daily.  Dispense: 30 tablet; Refill: 1 - DG Knee Complete 4 Views Right; Future - Follow-up 3-4 weeks, sooner if no improvement. If doing better, start strengthening exercises at that time.    Reviewed expectations re: course of current medical issues.  Discussed self-management of symptoms.  Outlined signs and symptoms indicating need for more acute intervention.  Patient verbalized understanding and all questions were answered.  Patient received an After-Visit Summary.   Note is dictated utilizing voice recognition software. Although note has been proof read prior to signing, occasional typographical errors still can be missed. If any questions arise, please do not hesitate to call for verification.   electronically signed by:  Howard Pouch, DO  [place]

## 2017-04-24 NOTE — Telephone Encounter (Signed)
Please call patient: - Her x-ray has resulted and did show some arthritic changes, including some decrease in joint space on the inside of her knee where she was tender on exam today. - Recommendations are for her to her the knee sleeve provided today during waking hours, no twisting/exercising for 2 weeks, use the meloxicam every day/once daily and follow-up in 3-4 weeks. - If pain is worsening, follow-up sooner.

## 2017-04-24 NOTE — Patient Instructions (Signed)
Start meloxicam once before bed (with a little food) Get xray at [facility] on [address], [place], [place] today. Wear knee sleeve during waking hours.  Follow up in 3-4 weeks if not better.   I think you may have a mild meniscus injury or strain.    Meniscus Tear A meniscus tear is a knee injury in which a piece of the meniscus is torn. The meniscus is a thick, rubbery, wedge-shaped cartilage in the knee. Two menisci are located in each knee. They sit between the upper bone (femur) and lower bone (tibia) that make up the knee joint. Each meniscus acts as a shock absorber for the knee. A torn meniscus is one of the most common types of knee injuries. This injury can range from mild to severe. Surgery may be needed for a severe tear. What are the causes? This injury may be caused by any squatting, twisting, or pivoting movement. Sports-related injuries are the most common cause. These often occur from:  Running and stopping suddenly.  Changing direction.  Being tackled or knocked off your feet.  As people get older, their meniscus gets thinner and weaker. In these people, tears can happen more easily, such as from climbing stairs. What increases the risk? This injury is more likely to happen to:  People who play contact sports.  Males.  People who are 70?62 years of age.  What are the signs or symptoms? Symptoms of this injury include:  Knee pain, especially at the side of the knee joint. You may feel pain when the injury occurs, or you may only hear a pop and feel pain later.  A feeling that your knee is clicking, catching, locking, or giving way.  Not being able to fully bend or extend your knee.  Bruising or swelling in your knee.  How is this diagnosed? This injury may be diagnosed based on your symptoms and a physical exam. The physical exam may include:  Moving your knee in different ways.  Feeling for tenderness.  Listening for a clicking  sound.  Checking if your knee locks or catches.  You may also have tests, such as:  X-rays.  MRI.  A procedure to look inside your knee with a narrow surgical telescope (arthroscopy).  You may be referred to a knee specialist (orthopedic surgeon). How is this treated? Treatment for this injury depends on the severity of the tear. Treatment for a mild tear may include:  Rest.  Medicine to reduce pain and swelling. This is usually a nonsteroidal anti-inflammatory drug (NSAID).  A knee brace or an elastic sleeve or wrap.  Using crutches or a walker to keep weight off your knee and to help you walk.  Exercises to strengthen your knee (physical therapy).  You may need surgery if you have a severe tear or if other treatments are not working. Follow these instructions at home: Managing pain and swelling  Take over-the-counter and prescription medicines only as told by your health care provider.  If directed, apply ice to the injured area: ? Put ice in a plastic bag. ? Place a towel between your skin and the bag. ? Leave the ice on for 20 minutes, 2-3 times per day.  Raise (elevate) the injured area above the level of your heart while you are sitting or lying down. Activity  Do not use the injured limb to support your body weight until your health care provider says that you can. Use crutches or a walker  as told by your health care provider.  Return to your normal activities as told by your health care provider. Ask your health care provider what activities are safe for you.  Perform range-of-motion exercises only as told by your health care provider.  Begin doing exercises to strengthen your knee and leg muscles only as told by your health care provider. After you recover, your health care provider may recommend these exercises to help prevent another injury. General instructions  Use a knee brace or elastic wrap as told by your health care provider.  Keep all follow-up  visits as told by your health care provider. This is important. Contact a health care provider if:  You have a fever.  Your knee becomes red, tender, or swollen.  Your pain medicine is not helping.  Your symptoms get worse or do not improve after 2 weeks of home care. This information is not intended to replace advice given to you by your health care provider. Make sure you discuss any questions you have with your health care provider. Document Released: 01/10/2003 Document Revised: 03/27/2016 Document Reviewed: 02/12/2015 Elsevier Interactive Patient Education  [employer].

## 2017-04-24 NOTE — Telephone Encounter (Signed)
Left message with results and instructions on patient voice mail per DPR 

## 2017-05-08 ENCOUNTER — Ambulatory Visit (INDEPENDENT_AMBULATORY_CARE_PROVIDER_SITE_OTHER): Payer: BLUE CROSS/BLUE SHIELD | Admitting: Family Medicine

## 2017-05-08 ENCOUNTER — Encounter: Payer: Self-pay | Admitting: Family Medicine

## 2017-05-08 VITALS — BP 124/72 | HR 75 | Temp 98.0°F | Resp 16 | Wt 158.0 lb

## 2017-05-08 DIAGNOSIS — S8991XD Unspecified injury of right lower leg, subsequent encounter: Secondary | ICD-10-CM | POA: Diagnosis not present

## 2017-05-08 MED ORDER — CYCLOBENZAPRINE HCL 5 MG PO TABS: 5.0000 mg | ORAL_TABLET | Freq: Three times a day (TID) | ORAL | 1 refills | Status: DC | PRN

## 2017-05-08 NOTE — Progress Notes (Signed)
Gloria [name] , 1956-09-02, 61 y.o., female MRN: [id number] Patient Care Team    Relationship Specialty Notifications Start End  Ma Hillock, DO PCP - General Family Medicine  08/20/15     Chief Complaint  Patient presents with  . Leg Pain    Right leg pain from groin area down to bottom of leg     Subjective:  Patient presents for follow-up on her groin and knee pain. She has been wearing the knee compression stocking and it has helped a little, but she still has discomfort in the medial joint line. She continues to have discomfort in her groin area intermittently as well. She is taking the meloxicam daily and has noticed some mild improvement with the discomfort. She still is having some increased discomfort in the evening. She now endorses feeling clicking in her knee when walking. Right knee x-ray June 22 2018th with medial greater than lateral joint space narrowing and marginal osteophyte formation. Mild degenerative changes at the patellofemoral joint. Knee joint it was aligned.  Prior note: Pt presents for an OV with complaints of right leg of 3 weeks duration. She reports she has recently started water aerobics. She does recall feeling a "pop " the day before onset of pain. She reports she thought that continuing exercising would make it eventually get better, but it has not. She denies any redness or swelling of her right knee. She has been able to ambulate on her knee immediately after injury and since the injury. She has never had an injury or surgery to her knee. The pain is worse when bending her knee. The pain is located medial, lateral and posterior knee. She reports it bothers her the most at night when it throbs. She occasionally will take 200-400 milligrams of Advil if it is hurting bad. She states most the time throughout the day, it's just an annoying pain.  Dg Knee Complete 4 Views Right Result Date: 04/24/2017 CLINICAL DATA:  61 year old female with a history of knee  pain EXAM: RIGHT KNEE - COMPLETE 4+ VIEW COMPARISON:  None. FINDINGS: No acute displaced fracture. No focal soft tissue swelling. No joint effusion. No radiopaque foreign body. Medial greater than lateral joint space narrowing and marginal osteophyte formation. Mild degenerative changes at the patellofemoral joint. Knee joint is aligned. IMPRESSION: Negative for acute bony abnormality. Early tricompartmental osteoarthritis Electronically Signed   By: Corrie Mckusick D.O.   On: 04/24/2017 15:28   No flowsheet data found.  No Known Allergies Social History  Substance Use Topics  . Smoking status: Never Smoker  . Smokeless tobacco: Never Used  . Alcohol use No   Past Medical History:  Diagnosis Date  . Allergy   . High cholesterol   . Hypertension   . Seasonal allergies    Past Surgical History:  Procedure Laterality Date  . NO PAST SURGERIES     Family History  Problem Relation Age of Onset  . Heart disease Father   . Alcohol abuse Brother   . Kidney disease Brother    Allergies as of 05/08/2017   No Known Allergies     Medication List       Accurate as of 05/08/17  8:13 AM. Always use your most recent med list.          aspirin 81 MG chewable tablet Chew 81 mg by mouth. Reported on 12/14/2015   lisinopril-hydrochlorothiazide 10-12.5 MG tablet Commonly known as:  PRINZIDE,ZESTORETIC Take 1 tablet by mouth daily. TAKE  ONE TABLET BY MOUTH ONE TIME DAILY   meloxicam 15 MG tablet Commonly known as:  MOBIC Take 1 tablet (15 mg total) by mouth daily.       All past medical history, surgical history, allergies, family history, immunizations andmedications were updated in the EMR today and reviewed under the history and medication portions of their EMR.     ROS: Negative, with the exception of above mentioned in HPI   Objective:  BP 124/72 (BP Location: Right Arm, Patient Position: Sitting, Cuff Size: Normal)   Pulse 75   Temp 98 F (36.7 C) (Oral)   Resp 16   Wt 158  lb (71.7 kg)   SpO2 97%   BMI 31.91 kg/m  Body mass index is 31.91 kg/m.  Gen: Afebrile. No acute distress.  HENT: AT. [place]. MMM.  Eyes:Pupils Equal Round Reactive to light, Extraocular movements intact,  Conjunctiva without redness, discharge or icterus. MSK: No erythema, redness or swelling of knee or thigh. Thigh nontender to palpation. Medial knee joint line tenderness to palpation. No ligament laxity. Negative Lachman's, positive McMurray's, neurovascularly intact distally. Neuro: Mild limp. PERLA. EOMi. Alert. Oriented x3    No exam data present No results found. No results found for this or any previous visit (from the past 24 hour(s)).  Assessment/Plan: Gloria [name] is a 61 y.o. female present for OV for  Injury of right knee, subsequent encounter - Reviewed patient's x-rays with her today. The medics seems to be helping fraction with the knee, however she is now able to feel a clicking sensation in her knee with walking. She is experiencing more medial thigh discomfort which is likely strain from her original injury, or from compensating in her gait - Continue knee compression sleeve. - Flexeril prescribed for muscle pain. Can continue Aspercreme as well. - Continue to rest, elevate. - Referral to orthopedics placed today.   Reviewed expectations re: course of current medical issues.  Discussed self-management of symptoms.  Outlined signs and symptoms indicating need for more acute intervention.  Patient verbalized understanding and all questions were answered.  Patient received an After-Visit Summary.   Note is dictated utilizing voice recognition software. Although note has been proof read prior to signing, occasional typographical errors still can be missed. If any questions arise, please do not hesitate to call for verification.   electronically signed by:  Howard Pouch, DO  [place]

## 2017-05-08 NOTE — Patient Instructions (Signed)
Discoid Meniscus A meniscus is a wedge of tough, rubbery tissue in the knee. The meniscus stabilizes your knee and absorbs shock. A discoid meniscus is a meniscus that is shaped differently than normal. It may be:  Thicker and wider than normal (incomplete).  Thicker than normal, oval, and over the whole top of the shinbone (complete).  Not attached to the shinbone and able to move around inside the knee (hypermobile Wrisberg).  There is one meniscus on the inside and outside of each knee. Discoid meniscus usually affects the meniscus on the outside of the knee. Having this condition makes the meniscus more likely to tear. What are the causes? The cause of this condition is not known. You are born with this condition. What increases the risk? This condition is more common among some Asian populations. What are the signs or symptoms? This condition does not always cause symptoms. Symptoms are more common if you have a torn or unstable meniscus. Symptoms include:  Locking, catching, or popping of the knee.  Pain.  Swelling.  Stiffness.  Inability to straighten your knee.  Feeling like your knee is giving way (instability).  How is this diagnosed? This condition may be diagnosed based on:  Your symptoms.  Your medical history.  A physical exam.  Imaging tests, such as: ? X-rays to check for a wide area between the thighbone and the shinbone. ? An MRI to check the shape, size, and attachment of the meniscus and to see if it has torn.  During your physical exam, your health care provider will move your knee in different directions to check for pain, locking, or popping in your knee. How is this treated? If you do not have symptoms, treatment is not needed. If you do have symptoms, treatment involves having surgery to reshape, trim, or repair the meniscus. After surgery, you may need a knee brace to protect your knee. You may also need to use crutches or a wheelchair to keep  weight off of your knee while it heals. Your health care provider or a physical therapist will recommend range-of-motion and strengthening exercises (physical therapy) to regain movement and strength in your knee. Follow these instructions at home:  Ask your health care provider what activities are safe for you.  Keep all follow-up visits as told by your health care provider. This is important. Contact a health care provider if:  You start to have symptoms of this condition. This information is not intended to replace advice given to you by your health care provider. Make sure you discuss any questions you have with your health care provider. Document Released: 10/20/2005 Document Revised: 02/29/2016 Document Reviewed: 09/28/2015 Elsevier Interactive Patient Education  2018 [place] [employer].    Muscle relaxer prescribed.  Continue mobic Referral to orthopedics .

## 2017-05-22 DIAGNOSIS — M1711 Unilateral primary osteoarthritis, right knee: Secondary | ICD-10-CM | POA: Diagnosis not present

## 2017-08-31 ENCOUNTER — Encounter: Payer: Self-pay | Admitting: Family Medicine

## 2017-08-31 ENCOUNTER — Ambulatory Visit (INDEPENDENT_AMBULATORY_CARE_PROVIDER_SITE_OTHER): Payer: BLUE CROSS/BLUE SHIELD | Admitting: Family Medicine

## 2017-08-31 VITALS — BP 133/81 | HR 62 | Temp 98.3°F | Resp 20 | Wt 155.5 lb

## 2017-08-31 DIAGNOSIS — L929 Granulomatous disorder of the skin and subcutaneous tissue, unspecified: Secondary | ICD-10-CM

## 2017-08-31 DIAGNOSIS — M1711 Unilateral primary osteoarthritis, right knee: Secondary | ICD-10-CM

## 2017-08-31 MED ORDER — DICLOFENAC SODIUM ER 100 MG PO TB24: 100.0000 mg | ORAL_TABLET | Freq: Every day | ORAL | 0 refills | Status: DC

## 2017-08-31 MED ORDER — TRIAMCINOLONE ACETONIDE 0.1 % EX CREA: 1.0000 "application " | TOPICAL_CREAM | Freq: Two times a day (BID) | CUTANEOUS | 0 refills | Status: DC

## 2017-08-31 NOTE — Progress Notes (Signed)
Gloria [name] , Apr 15, 1956, 61 y.o., female MRN: [id number] Patient Care Team    Relationship Specialty Notifications Start End  Gloria Hillock, DO PCP - General Family Medicine  08/20/15     Chief Complaint  Patient presents with  . Knee Pain    right  . Toe Pain    right foot     Subjective: Pt presents for an OV with complaints of right knee and right large toe pain of a few weeks duration.    Right knee pain: Patient has a history of right knee osteoarthritis. She had an injury a few months ago that was concerning for possible meniscal injury. X-ray was positive for early tricompartmental osteoarthritis, with medial degenerative changes at the patellofemoral joint and medial greater than lateral joint space narrowing and marginal osteophyte formation. She is not taking the Motrin  Prescribed, she reports it did help some. She reports she followed up with orthopedics, unable to verify, notes not available in electronic records.  Right toe pain: Patient states she was clipping her toenails and caused herself an injury to the right large toe. It was healing adequately, and she pulled off the scab accidentally. Since that time she has noticed a warty-like growth in the area of original injury. She endorses some mild redness and swelling surrounding the area.   No flowsheet data found.  No Known Allergies Social History  Substance Use Topics  . Smoking status: Never Smoker  . Smokeless tobacco: Never Used  . Alcohol use No   Past Medical History:  Diagnosis Date  . Allergy   . High cholesterol   . Hypertension   . Seasonal allergies    Past Surgical History:  Procedure Laterality Date  . NO PAST SURGERIES     Family History  Problem Relation Age of Onset  . Heart disease Father   . Alcohol abuse Brother   . Kidney disease Brother    Allergies as of 08/31/2017   No Known Allergies     Medication List       Accurate as of 08/31/17 10:33 AM. Always use your most  recent med list.          aspirin 81 MG chewable tablet Chew 81 mg by mouth. Reported on 12/14/2015   lisinopril-hydrochlorothiazide 10-12.5 MG tablet Commonly known as:  PRINZIDE,ZESTORETIC Take 1 tablet by mouth daily. TAKE ONE TABLET BY MOUTH ONE TIME DAILY   meloxicam 15 MG tablet Commonly known as:  MOBIC Take 1 tablet (15 mg total) by mouth daily.       All past medical history, surgical history, allergies, family history, immunizations andmedications were updated in the EMR today and reviewed under the history and medication portions of their EMR.     ROS: Negative, with the exception of above mentioned in HPI   Objective:  BP 133/81 (BP Location: Left Arm, Patient Position: Sitting, Cuff Size: Large)   Pulse 62   Temp 98.3 F (36.8 C)   Resp 20   Wt 155 lb 8 oz (70.5 kg)   SpO2 97%   BMI 31.41 kg/m  Body mass index is 31.41 kg/m. Gen: Afebrile. No acute distress. Nontoxic in appearance, well developed, well nourished.  HENT: AT. Gloria [name].MMM, no oral lesions.  Eyes:Pupils Equal Round Reactive to light, Extraocular movements intact,  Conjunctiva without redness, discharge or icterus. MSK: No erythema, no soft tissue swelling, no effusions of right knee. Nontender to palpation bony prominence and joint line. No ligament laxity. Full  range of motion. Crepitus present. Neurovascular intact. Skin: Large right toe with very mild redness and swelling.~ 1 cm granuloma present over distal medial nail  Bed. No drainage noted. Neuro:Normal gait. PERLA. EOMi. Alert. Oriented x3  No exam data present No results found. No results found for this or any previous visit (from the past 24 hour(s)).  Assessment/Plan: [name] is a 61 y.o. female present for OV for  Granuloma of great toe - New problem. Patient with injury after cutting back her toenails. With repeat injury now forming granuloma. Patient was encouraged to keep area clean and covered. Can use topical steroid cream to  help with inflammatory process. Referral to podiatry to have removal of granuloma.  - Ambulatory referral to Podiatry - triamcinolone cream (KENALOG) 0.1 %; Apply 1 application topically 2 (two) times daily.  Dispense: 30 g; Refill: 0  Primary osteoarthritis of right knee - Discussed different options with patient today. She has seen some improvement but still having some discomfort. She is wearing her compression stocking during working hours. She has not been taking the meloxicam, therefore will try Voltaren instead to see if she has better response. - Discussed the injections today, and patient was not interested in having the injections at this time. - Uncertain if she followed through with orthopedic referral, if not inpatient does not seem responsible tearing could consider orthopedic referral or knee injection. - Diclofenac Sodium CR 100 MG 24 hr tablet; Take 1 tablet (100 mg total) by mouth daily.  Dispense: 90 tablet; Refill: 0   Reviewed expectations re: course of current medical issues.  Discussed self-management of symptoms.  Outlined signs and symptoms indicating need for more acute intervention.  Patient verbalized understanding and all questions were answered.  Patient received an After-Visit Summary.    No orders of the defined types were placed in this encounter.    Note is dictated utilizing voice recognition software. Although note has been proof read prior to signing, occasional typographical errors still can be missed. If any questions arise, please do not hesitate to call for verification.   electronically signed by:  Gloria Pouch, DO  [place]

## 2017-08-31 NOTE — Patient Instructions (Signed)
Referral to Podiatry for your toe, use the cream daily.   Knee pain- take the diclofenac every day, once with a meal.     Arthritis Arthritis is a term that is commonly used to refer to joint pain or joint disease. There are more than 100 types of arthritis. What are the causes? The most common cause of this condition is wear and tear of a joint. Other causes include:  Gout.  Inflammation of a joint.  An infection of a joint.  Sprains and other injuries near the joint.  A drug reaction or allergic reaction.  In some cases, the cause may not be known. What are the signs or symptoms? The main symptom of this condition is pain in the joint with movement. Other symptoms include:  Redness, swelling, or stiffness at a joint.  Warmth coming from the joint.  Fever.  Overall feeling of illness.  How is this diagnosed? This condition may be diagnosed with a physical exam and tests, including:  Blood tests.  Urine tests.  Imaging tests, such as MRI, X-rays, or a CT scan.  Sometimes, fluid is removed from a joint for testing. How is this treated? Treatment for this condition may involve:  Treatment of the cause, if it is known.  Rest.  Raising (elevating) the joint.  Applying cold or hot packs to the joint.  Medicines to improve symptoms and reduce inflammation.  Injections of a steroid such as cortisone into the joint to help reduce pain and inflammation.  Depending on the cause of your arthritis, you may need to make lifestyle changes to reduce stress on your joint. These changes may include exercising more and losing weight. Follow these instructions at home: Medicines  Take over-the-counter and prescription medicines only as told by your health care provider.  Do not take aspirin to relieve pain if gout is suspected. Activity  Rest your joint if told by your health care provider. Rest is important when your disease is active and your joint feels painful,  swollen, or stiff.  Avoid activities that make the pain worse. It is important to balance activity with rest.  Exercise your joint regularly with range-of-motion exercises as told by your health care provider. Try doing low-impact exercise, such as: ? Swimming. ? Water aerobics. ? Biking. ? Walking. Joint Care   If your joint is swollen, keep it elevated if told by your health care provider.  If your joint feels stiff in the morning, try taking a warm shower.  If directed, apply heat to the joint. If you have diabetes, do not apply heat without permission from your health care provider. ? Put a towel between the joint and the hot pack or heating pad. ? Leave the heat on the area for 20-30 minutes.  If directed, apply ice to the joint: ? Put ice in a plastic bag. ? Place a towel between your skin and the bag. ? Leave the ice on for 20 minutes, 2-3 times per day.  Keep all follow-up visits as told by your health care provider. This is important. Contact a health care provider if:  The pain gets worse.  You have a fever. Get help right away if:  You develop severe joint pain, swelling, or redness.  Many joints become painful and swollen.  You develop severe back pain.  You develop severe weakness in your leg.  You cannot control your bladder or bowels. This information is not intended to replace advice given to you by your health  care provider. Make sure you discuss any questions you have with your health care provider. Document Released: 11/27/2004 Document Revised: 03/27/2016 Document Reviewed: 01/15/2015 Elsevier Interactive Patient Education  [employer].

## 2017-09-07 ENCOUNTER — Other Ambulatory Visit: Payer: Self-pay | Admitting: *Deleted

## 2017-09-07 ENCOUNTER — Encounter: Payer: Self-pay | Admitting: Podiatry

## 2017-09-07 ENCOUNTER — Ambulatory Visit: Payer: BLUE CROSS/BLUE SHIELD | Admitting: Podiatry

## 2017-09-07 VITALS — BP 163/81 | HR 66 | Ht 59.0 in | Wt 154.0 lb

## 2017-09-07 DIAGNOSIS — L929 Granulomatous disorder of the skin and subcutaneous tissue, unspecified: Secondary | ICD-10-CM | POA: Diagnosis not present

## 2017-09-07 DIAGNOSIS — L600 Ingrowing nail: Secondary | ICD-10-CM

## 2017-09-07 DIAGNOSIS — I10 Essential (primary) hypertension: Secondary | ICD-10-CM

## 2017-09-07 MED ORDER — LISINOPRIL-HYDROCHLOROTHIAZIDE 10-12.5 MG PO TABS: 1.0000 | ORAL_TABLET | Freq: Every day | ORAL | 0 refills | Status: DC

## 2017-09-07 NOTE — Patient Instructions (Addendum)
Ingrown nail surgery was done on right great toe lateral border. Follow soaking instruction.  Some redness and drainage is expected. Call the office if the area gets feverish with increased redness and drainage. Return in one week.

## 2017-09-07 NOTE — Progress Notes (Signed)
.  SUBJECTIVE: 61 y.o. year old female presents complaining of a growth on right great toe for 2 weeks.  Review of Systems  Constitutional: Negative.   HENT: Negative.   Eyes: Negative.   Respiratory: Negative.   Cardiovascular: Negative.   Gastrointestinal: Negative.   Genitourinary: Negative.   Musculoskeletal: Negative.    OBJECTIVE: DERMATOLOGIC EXAMINATION: Ingrown nail with proud flesh right great toe lateral border.  VASCULAR EXAMINATION OF LOWER LIMBS: All pedal pulses are palpable with normal pulsation.  Capillary Filling times within 3 seconds in all digits.  No expanded edema or erythema associated with nail lesion noted. Temperature gradient from tibial crest to dorsum of foot is within normal bilateral.  NEUROLOGIC EXAMINATION OF THE LOWER LIMBS: All epicritic and tactile sensations grossly intact. Sharp and Dull discriminatory sensations at the plantar ball of hallux is intact bilateral.   MUSCULOSKELETAL EXAMINATION: No gross deformities noted.  ASSESSMENT: Ingrown nail with proud flesh lateral border right great toe.  PLAN: Reviewed clinical findings and available treatment options. Procedure done: Phenol and alcohol matrixectomy right great toe lateral border. Procedure done as follow; Affected right great toe was anesthetized with total 109ml mixture of 50/50 0.5% Marcaine plain and 1% Xylocaine plain. Affected nail border was reflected with a nail elevator and excised with nail nipper. Proximal nail matrix tissue was cauterized with Phenol soaked cotton applicator x 4 and neutralized with Alcohol soaked cotton applicator. The wound was dressed with Amerigel ointment dressing. Home care instructions and supply dispensed.  Return in 1 week for follow up.

## 2017-09-14 ENCOUNTER — Encounter: Payer: Self-pay | Admitting: Podiatry

## 2017-09-14 ENCOUNTER — Ambulatory Visit (INDEPENDENT_AMBULATORY_CARE_PROVIDER_SITE_OTHER): Payer: BLUE CROSS/BLUE SHIELD | Admitting: Podiatry

## 2017-09-14 DIAGNOSIS — L600 Ingrowing nail: Secondary | ICD-10-CM

## 2017-09-14 NOTE — Patient Instructions (Signed)
1 week post op right great toe nail. Wound healing normal. Continue with daily soak for another week. Return as needed.

## 2017-09-14 NOTE — Progress Notes (Signed)
1 week post op right great toe nail lateral border.  Clean and well coapted nail border. Wound healing normal without complication.  Continue with daily soak for another week. Return as needed.

## 2017-10-09 ENCOUNTER — Other Ambulatory Visit: Payer: Self-pay | Admitting: *Deleted

## 2017-10-09 DIAGNOSIS — I10 Essential (primary) hypertension: Secondary | ICD-10-CM

## 2017-10-09 MED ORDER — LISINOPRIL-HYDROCHLOROTHIAZIDE 10-12.5 MG PO TABS: 1.0000 | ORAL_TABLET | Freq: Every day | ORAL | 0 refills | Status: DC

## 2017-10-09 NOTE — Telephone Encounter (Signed)
Left message for patient needs office visit prior to anymore refills. Sent in 14 day supply of BP medication

## 2017-11-23 ENCOUNTER — Other Ambulatory Visit: Payer: Self-pay | Admitting: *Deleted

## 2017-11-23 DIAGNOSIS — M1711 Unilateral primary osteoarthritis, right knee: Secondary | ICD-10-CM

## 2017-11-23 MED ORDER — DICLOFENAC SODIUM ER 100 MG PO TB24: 100.0000 mg | ORAL_TABLET | Freq: Every day | ORAL | 0 refills | Status: DC

## 2018-02-12 ENCOUNTER — Ambulatory Visit: Payer: BLUE CROSS/BLUE SHIELD | Admitting: Family Medicine

## 2018-02-12 ENCOUNTER — Encounter: Payer: Self-pay | Admitting: Family Medicine

## 2018-02-12 VITALS — BP 145/83 | HR 71 | Temp 98.0°F | Resp 20 | Ht 59.0 in | Wt 156.0 lb

## 2018-02-12 DIAGNOSIS — M795 Residual foreign body in soft tissue: Secondary | ICD-10-CM | POA: Diagnosis not present

## 2018-02-12 DIAGNOSIS — L0201 Cutaneous abscess of face: Secondary | ICD-10-CM

## 2018-02-12 DIAGNOSIS — T63441A Toxic effect of venom of bees, accidental (unintentional), initial encounter: Secondary | ICD-10-CM | POA: Diagnosis not present

## 2018-02-12 MED ORDER — AMOXICILLIN-POT CLAVULANATE 875-125 MG PO TABS: 1.0000 | ORAL_TABLET | Freq: Two times a day (BID) | ORAL | 0 refills | Status: AC

## 2018-02-12 NOTE — Patient Instructions (Signed)
Keep area clean and dry. Continue to use antibiotic ointment.  Take oral antibiotics for 5 days. These are called in.    You did a foreign body still stuck in the wound, it has been removed so it should heal now within a week.     Wound Care, Adult Taking care of your wound properly can help to prevent pain and infection. It can also help your wound to heal more quickly. How is this treated? Wound care  Follow instructions from your health care provider about how to take care of your wound. Make sure you: ? Wash your hands with soap and water before you change the bandage (dressing). If soap and water are not available, use hand sanitizer. ? Change your dressing as told by your health care provider. ? Leave stitches (sutures), skin glue, or adhesive strips in place. These skin closures may need to stay in place for 2 weeks or longer. If adhesive strip edges start to loosen and curl up, you may trim the loose edges. Do not remove adhesive strips completely unless your health care provider tells you to do that.  Check your wound area every day for signs of infection. Check for: ? More redness, swelling, or pain. ? More fluid or blood. ? Warmth. ? Pus or a bad smell.  Ask your health care provider if you should clean the wound with mild soap and water. Doing this may include: ? Using a clean towel to pat the wound dry after cleaning it. Do not rub or scrub the wound. ? Applying a cream or ointment. Do this only as told by your health care provider. ? Covering the incision with a clean dressing.  Ask your health care provider when you can leave the wound uncovered. Medicines   If you were prescribed an antibiotic medicine, cream, or ointment, take or use the antibiotic as told by your health care provider. Do not stop taking or using the antibiotic even if your condition improves.  Take over-the-counter and prescription medicines only as told by your health care provider. If you were  prescribed pain medicine, take it at least 30 minutes before doing any wound care or as told by your health care provider. General instructions  Return to your normal activities as told by your health care provider. Ask your health care provider what activities are safe.  Do not scratch or pick at the wound.  Keep all follow-up visits as told by your health care provider. This is important.  Eat a diet that includes protein, vitamin A, vitamin C, and other nutrient-rich foods. These help the wound heal: ? Protein-rich foods include meat, dairy, beans, nuts, and other sources. ? Vitamin A-rich foods include carrots and dark green, leafy vegetables. ? Vitamin C-rich foods include citrus, tomatoes, and other fruits and vegetables. ? Nutrient-rich foods have protein, carbohydrates, fat, vitamins, or minerals. Eat a variety of healthy foods including vegetables, fruits, and whole grains. Contact a health care provider if:  You received a tetanus shot and you have swelling, severe pain, redness, or bleeding at the injection site.  Your pain is not controlled with medicine.  You have more redness, swelling, or pain around the wound.  You have more fluid or blood coming from the wound.  Your wound feels warm to the touch.  You have pus or a bad smell coming from the wound.  You have a fever or chills.  You are nauseous or you vomit.  You are dizzy. Get help  right away if:  You have a red streak going away from your wound.  The edges of the wound open up and separate.  Your wound is bleeding and the bleeding does not stop with gentle pressure.  You have a rash.  You faint.  You have trouble breathing. This information is not intended to replace advice given to you by your health care provider. Make sure you discuss any questions you have with your health care provider. Document Released: 07/29/2008 Document Revised: 06/18/2016 Document Reviewed: 05/06/2016 Elsevier Interactive  Patient Education  2017 [employer].

## 2018-02-12 NOTE — Progress Notes (Signed)
Gloria [name] , 1956-04-08, 62 y.o., female MRN: [id number] Patient Care Team    Relationship Specialty Notifications Start End  Ma Hillock, DO PCP - General Family Medicine  08/20/15     Chief Complaint  Patient presents with  . Insect Bite    bee sting 1 week ago     Subjective: Pt presents for an OV with complaints of swelling and tenderness lip of 1 week duration after a bee sting.   Associated symptoms include pus like drainage. Bee sting occurred 1 week ago in the left corner of her lips/mouth. She denies fever or chills. She reports it initially swelled, but she put ice on it and it went down. A bump remained in the corner of her mouth and she squeezed it producing pus-like drainage.   Depression screen PHQ 2/9 02/12/2018  Decreased Interest 0  Down, Depressed, Hopeless 0  PHQ - 2 Score 0    No Known Allergies Social History   Tobacco Use  . Smoking status: Never Smoker  . Smokeless tobacco: Never Used  Substance Use Topics  . Alcohol use: No    Alcohol/week: 0.0 oz   Past Medical History:  Diagnosis Date  . Allergy   . High cholesterol   . Hypertension   . Seasonal allergies    Past Surgical History:  Procedure Laterality Date  . NO PAST SURGERIES     Family History  Problem Relation Age of Onset  . Heart disease Father   . Alcohol abuse Brother   . Kidney disease Brother    Allergies as of 02/12/2018   No Known Allergies     Medication List        Accurate as of 02/12/18  1:49 PM. Always use your most recent med list.          aspirin 81 MG chewable tablet Chew 81 mg by mouth. Reported on 12/14/2015   Diclofenac Sodium CR 100 MG 24 hr tablet Take 1 tablet (100 mg total) by mouth daily.   lisinopril-hydrochlorothiazide 10-12.5 MG tablet Commonly known as:  PRINZIDE,ZESTORETIC Take 1 tablet by mouth daily. Needs office visit prior to anymore refills.   triamcinolone cream 0.1 % Commonly known as:  KENALOG Apply 1 application topically 2  (two) times daily.       All past medical history, surgical history, allergies, family history, immunizations andmedications were updated in the EMR today and reviewed under the history and medication portions of their EMR.     ROS: Negative, with the exception of above mentioned in HPI   Objective:  BP (!) 145/83 (BP Location: Right Arm, Patient Position: Sitting, Cuff Size: Large)   Pulse 71   Temp 98 F (36.7 C)   Resp 20   Ht 4\' 11"  (1.499 m)   Wt 156 lb (70.8 kg)   SpO2 97%   BMI 31.51 kg/m  Body mass index is 31.51 kg/m. Gen: Afebrile. No acute distress. Nontoxic in appearance, well developed, well nourished.  HENT: Mouth with 2 mm red, raised fluctuant lesion left lateral commissure.  No exam data present No results found. No results found for this or any previous visit (from the past 24 hour(s)).  Assessment/Plan: Gloria [name] is a 62 y.o. female present for OV for  Cutaneous abscess of face Bee sting, accidental or unintentional, initial encounter Foreign body (FB) in soft tissue - I&D/FB removal of left lateral commissure abscess performed in the office today. Small FB, likely encapsulated stinger was expelled  along with small amount pus-like drainage.  Augmentin BID for 5 days prescribed.  Procedure Note:  PROCEDURE NOTE: Incision and Drainage/FB removal Performed by: Dr. Raoul Pitch Indication: infection and pain Anesthesia was obtained with 0.5 ml of 1% lidocain . The area was prepped in the usual sterile fashion. A number 11 scalpel was used to create an incision  in abscess to allow drainage. Small Foreign body (FB)  Expelled with small amount drainage.A dressing was placed over the site. The patient tolerated the procedure well. Wound care instructions were given.  Post-Procedure Diagnosis: Foreign body retention and abscess Complications: None Estimated Blood Loss:  <0.5 ml F/u PRN   Reviewed expectations re: course of current medical issues.  Discussed  self-management of symptoms.  Outlined signs and symptoms indicating need for more acute intervention.  Patient verbalized understanding and all questions were answered.  Patient received an After-Visit Summary.    No orders of the defined types were placed in this encounter.    Note is dictated utilizing voice recognition software. Although note has been proof read prior to signing, occasional typographical errors still can be missed. If any questions arise, please do not hesitate to call for verification.   electronically signed by:  Howard Pouch, DO  [place]

## 2018-02-16 ENCOUNTER — Other Ambulatory Visit: Payer: Self-pay | Admitting: *Deleted

## 2018-02-16 DIAGNOSIS — M1711 Unilateral primary osteoarthritis, right knee: Secondary | ICD-10-CM

## 2018-02-16 MED ORDER — DICLOFENAC SODIUM ER 100 MG PO TB24: 100.0000 mg | ORAL_TABLET | Freq: Every day | ORAL | 0 refills | Status: DC

## 2018-02-22 ENCOUNTER — Telehealth: Payer: Self-pay | Admitting: Family Medicine

## 2018-02-22 NOTE — Telephone Encounter (Signed)
Copied from [place] [id number]. Topic: Quick Communication - See Telephone Encounter >> Feb 22, 2018  4:24 PM Conception Chancy, NT wrote: CRM for notification. See Telephone encounter for: 02/22/18.  Patient needs a refill on lisinopril-hydrochlorothiazide (PRINZIDE,ZESTORETIC) 10-12.5 MG tablet   Please send to [id number] - [place], [place] - [address] AT [place]

## 2018-02-23 ENCOUNTER — Other Ambulatory Visit: Payer: Self-pay

## 2018-02-23 DIAGNOSIS — I10 Essential (primary) hypertension: Secondary | ICD-10-CM

## 2018-02-23 MED ORDER — LISINOPRIL-HYDROCHLOROTHIAZIDE 10-12.5 MG PO TABS: 1.0000 | ORAL_TABLET | Freq: Every day | ORAL | 3 refills | Status: DC

## 2018-03-15 ENCOUNTER — Ambulatory Visit: Payer: BLUE CROSS/BLUE SHIELD | Admitting: Family Medicine

## 2018-03-15 ENCOUNTER — Encounter: Payer: Self-pay | Admitting: Family Medicine

## 2018-03-15 VITALS — BP 139/84 | HR 70 | Temp 98.3°F | Resp 20 | Ht 59.0 in | Wt 154.2 lb

## 2018-03-15 DIAGNOSIS — M79673 Pain in unspecified foot: Secondary | ICD-10-CM

## 2018-03-15 MED ORDER — PREDNISONE 20 MG PO TABS: ORAL_TABLET | ORAL | 0 refills | Status: DC

## 2018-03-15 NOTE — Progress Notes (Signed)
Gloria [name] , 11-13-1955, 62 y.o., female MRN: [id number] Patient Care Team    Relationship Specialty Notifications Start End  Ma Hillock, DO PCP - General Family Medicine  08/20/15     Chief Complaint  Patient presents with  . Foot Pain    right     Subjective: Pt presents for an OV with complaints of right 5th lateral toe pain of 1 day duration.  Associated symptoms include redness, swelling of 1 day duration. She has a h/o arthritis. She denies gout history or injury to area. She noticed when she stood up yesterday, a sharp pain occurred.   Depression screen PHQ 2/9 02/12/2018  Decreased Interest 0  Down, Depressed, Hopeless 0  PHQ - 2 Score 0    No Known Allergies Social History   Tobacco Use  . Smoking status: Never Smoker  . Smokeless tobacco: Never Used  Substance Use Topics  . Alcohol use: No    Alcohol/week: 0.0 oz   Past Medical History:  Diagnosis Date  . Allergy   . High cholesterol   . Hypertension   . Seasonal allergies    Past Surgical History:  Procedure Laterality Date  . NO PAST SURGERIES     Family History  Problem Relation Age of Onset  . Heart disease Father   . Alcohol abuse Brother   . Kidney disease Brother    Allergies as of 03/15/2018   No Known Allergies     Medication List        Accurate as of 03/15/18  2:59 PM. Always use your most recent med list.          aspirin 81 MG chewable tablet Chew 81 mg by mouth. Reported on 12/14/2015   Diclofenac Sodium CR 100 MG 24 hr tablet Take 1 tablet (100 mg total) by mouth daily.   lisinopril-hydrochlorothiazide 10-12.5 MG tablet Commonly known as:  PRINZIDE,ZESTORETIC Take 1 tablet by mouth daily.   triamcinolone cream 0.1 % Commonly known as:  KENALOG Apply 1 application topically 2 (two) times daily.       All past medical history, surgical history, allergies, family history, immunizations andmedications were updated in the EMR today and reviewed under the history and  medication portions of their EMR.     ROS: Negative, with the exception of above mentioned in HPI   Objective:  BP 139/84 (BP Location: Right Arm, Patient Position: Sitting, Cuff Size: Normal)   Pulse 70   Temp 98.3 F (36.8 C)   Resp 20   Ht 4\' 11"  (1.499 m)   Wt 154 lb 3.2 oz (69.9 kg)   SpO2 97%   BMI 31.14 kg/m  Body mass index is 31.14 kg/m. Gen: Afebrile. No acute distress. Nontoxic in appearance, well developed, well nourished.  Eyes:Pupils Equal Round Reactive to light, Extraocular movements intact,  Conjunctiva without redness, discharge or icterus. MSK: erythema, mild swelling, right 5th metatarsal head. Skin intact. TTP. NV intact distally.  Neuro: Normal gait. PERLA. EOMi. Alert. Oriented x3  Psych: Normal affect, dress and demeanor. Normal speech. Normal thought content and judgment.  No exam data present No results found. No results found for this or any previous visit (from the past 24 hour(s)).  Assessment/Plan: Gloria [name] is a 62 y.o. female present for OV for  Pain of foot, unspecified laterality - right 5th metatarsal pain, with redness and swelling today. No signs of infection. No injury reported.  - predniSONE (DELTASONE) 20 MG tablet; 60 mg x3d,  40 mg x3d, 20 mg x2d, 10 mg x2d  Dispense: 18 tablet; Refill: 0 - Uric acid - F/U 1-2 weeks if not improving or worsening, then may need to obtain xray.     Reviewed expectations re: course of current medical issues.  Discussed self-management of symptoms.  Outlined signs and symptoms indicating need for more acute intervention.  Patient verbalized understanding and all questions were answered.  Patient received an After-Visit Summary.    No orders of the defined types were placed in this encounter.    Note is dictated utilizing voice recognition software. Although note has been proof read prior to signing, occasional typographical errors still can be missed. If any questions arise, please do not  hesitate to call for verification.   electronically signed by:  Howard Pouch, DO  [place]

## 2018-03-15 NOTE — Patient Instructions (Signed)
This may be gout. I have called in a steroid taper take as directed. We will check for uric acid to see if this is gout.   If it is not getting better with treatment, then we will need to get xray of area.

## 2018-03-16 ENCOUNTER — Telehealth: Payer: Self-pay | Admitting: Family Medicine

## 2018-03-16 DIAGNOSIS — M109 Gout, unspecified: Secondary | ICD-10-CM | POA: Insufficient documentation

## 2018-03-16 LAB — URIC ACID: URIC ACID, SERUM: 7.3 mg/dL — AB (ref 2.5–7.0)

## 2018-03-16 NOTE — Telephone Encounter (Signed)
Please inform patient the following information: Her uric acid level was mildly elevated, suggesting that is a gout flare in her foot.   Take steroid, drink plenty of water to help acute discomfort.  Trying to follow follow a lower in  purine diet can help avoid future attacks.  She can check out this site for instructions if needed. [email or web address]

## 2018-03-16 NOTE — Telephone Encounter (Signed)
Patient notified and verbalized understanding. Patient stated that she is feeling better and does not have a computer to look information up on.

## 2018-03-16 NOTE — Telephone Encounter (Signed)
Patient states she missed a call from the office in regards to her results. Please contact patient. She states she gets off work at [employer].

## 2018-03-16 NOTE — Telephone Encounter (Signed)
Tried to contact patient unable to leave message.

## 2018-07-01 ENCOUNTER — Telehealth: Payer: Self-pay | Admitting: Family Medicine

## 2018-07-01 DIAGNOSIS — I10 Essential (primary) hypertension: Secondary | ICD-10-CM

## 2018-07-01 MED ORDER — LISINOPRIL-HYDROCHLOROTHIAZIDE 10-12.5 MG PO TABS: 1.0000 | ORAL_TABLET | Freq: Every day | ORAL | 0 refills | Status: DC

## 2018-07-01 NOTE — Telephone Encounter (Signed)
Copied from [place] [id number]. Topic: Quick Communication - Rx Refill/Question >> Jul 01, 2018 12:21 PM Synthia Innocent wrote: Medication:  lisinopril-hydrochlorothiazide (PRINZIDE,ZESTORETIC) 10-12.5 MG tablet  Has the patient contacted their pharmacy? Yes.   (Agent: If no, request that the patient contact the pharmacy for the refill.) (Agent: If yes, when and what did the pharmacy advise?)  Preferred Pharmacy (with phone number or street name): [facility]  Agent: Please be advised that RX refills may take up to 3 business days. We ask that you follow-up with your pharmacy.

## 2018-07-01 NOTE — Telephone Encounter (Signed)
Attempted to contact pt regarding prescription request; last office visit 12/19/16; no upcoming visits noted; unable to leave message on voicemail [phone]; will send 30 day refill per protocol to pharmacy with note that office visit needed for additional refills.

## 2018-07-23 ENCOUNTER — Encounter: Payer: Self-pay | Admitting: Family Medicine

## 2018-07-23 ENCOUNTER — Ambulatory Visit: Payer: BLUE CROSS/BLUE SHIELD | Admitting: Family Medicine

## 2018-07-23 VITALS — BP 129/81 | HR 64 | Temp 98.9°F | Resp 20 | Ht 59.0 in | Wt 156.5 lb

## 2018-07-23 DIAGNOSIS — M5432 Sciatica, left side: Secondary | ICD-10-CM | POA: Diagnosis not present

## 2018-07-23 DIAGNOSIS — M5431 Sciatica, right side: Secondary | ICD-10-CM

## 2018-07-23 MED ORDER — DICLOFENAC SODIUM ER 100 MG PO TB24: 100.0000 mg | ORAL_TABLET | Freq: Every day | ORAL | 1 refills | Status: DC

## 2018-07-23 NOTE — Patient Instructions (Signed)
Use diclofenac daily.  Watch for the emergent signs we discussed and seek care immediately if you experience.  Perform the stretches below. Make sure to use your legs with lifting.  If symptoms worsen, please be seen and we will have to perform images.      Sciatica Rehab Ask your health care provider which exercises are safe for you. Do exercises exactly as told by your health care provider and adjust them as directed. It is normal to feel mild stretching, pulling, tightness, or discomfort as you do these exercises, but you should stop right away if you feel sudden pain or your pain gets worse.Do not begin these exercises until told by your health care provider. Stretching and range of motion exercises These exercises warm up your muscles and joints and improve the movement and flexibility of your hips and your back. These exercises also help to relieve pain, numbness, and tingling. Exercise A: Sciatic nerve glide 1. Sit in a chair with your head facing down toward your chest. Place your hands behind your back. Let your shoulders slump forward. 2. Slowly straighten one of your knees while you tilt your head back as if you are looking toward the ceiling. Only straighten your leg as far as you can without making your symptoms worse. 3. Hold for __________ seconds. 4. Slowly return to the starting position. 5. Repeat with your other leg. Repeat __________ times. Complete this exercise __________ times a day. Exercise B: Knee to chest with hip adduction and internal rotation  1. Lie on your back on a firm surface with both legs straight. 2. Bend one of your knees and move it up toward your chest until you feel a gentle stretch in your lower back and buttock. Then, move your knee toward the shoulder that is on the opposite side from your leg. ? Hold your leg in this position by holding onto the front of your knee. 3. Hold for __________ seconds. 4. Slowly return to the starting  position. 5. Repeat with your other leg. Repeat __________ times. Complete this exercise __________ times a day. Exercise C: Prone extension on elbows  1. Lie on your abdomen on a firm surface. A bed may be too soft for this exercise. 2. Prop yourself up on your elbows. 3. Use your arms to help lift your chest up until you feel a gentle stretch in your abdomen and your lower back. ? This will place some of your body weight on your elbows. If this is uncomfortable, try stacking pillows under your chest. ? Your hips should stay down, against the surface that you are lying on. Keep your hip and back muscles relaxed. 4. Hold for __________ seconds. 5. Slowly relax your upper body and return to the starting position. Repeat __________ times. Complete this exercise __________ times a day. Strengthening exercises These exercises build strength and endurance in your back. Endurance is the ability to use your muscles for a long time, even after they get tired. Exercise D: Pelvic tilt 1. Lie on your back on a firm surface. Bend your knees and keep your feet flat. 2. Tense your abdominal muscles. Tip your pelvis up toward the ceiling and flatten your lower back into the floor. ? To help with this exercise, you may place a small towel under your lower back and try to push your back into the towel. 3. Hold for __________ seconds. 4. Let your muscles relax completely before you repeat this exercise. Repeat __________ times. Complete this exercise  __________ times a day. Exercise E: Alternating arm and leg raises  1. Get on your hands and knees on a firm surface. If you are on a hard floor, you may want to use padding to cushion your knees, such as an exercise mat. 2. Line up your arms and legs. Your hands should be below your shoulders, and your knees should be below your hips. 3. Lift your left leg behind you. At the same time, raise your right arm and straighten it in front of you. ? Do not lift your  leg higher than your hip. ? Do not lift your arm higher than your shoulder. ? Keep your abdominal and back muscles tight. ? Keep your hips facing the ground. ? Do not arch your back. ? Keep your balance carefully, and do not hold your breath. 4. Hold for __________ seconds. 5. Slowly return to the starting position and repeat with your right leg and your left arm. Repeat __________ times. Complete this exercise __________ times a day. Posture and body mechanics  Body mechanics refers to the movements and positions of your body while you do your daily activities. Posture is part of body mechanics. Good posture and healthy body mechanics can help to relieve stress in your body's tissues and joints. Good posture means that your spine is in its natural S-curve position (your spine is neutral), your shoulders are pulled back slightly, and your head is not tipped forward. The following are general guidelines for applying improved posture and body mechanics to your everyday activities. Standing   When standing, keep your spine neutral and your feet about hip-width apart. Keep a slight bend in your knees. Your ears, shoulders, and hips should line up.  When you do a task in which you stand in one place for a long time, place one foot up on a stable object that is 2-4 inches (5-10 cm) high, such as a footstool. This helps keep your spine neutral. Sitting   When sitting, keep your spine neutral and keep your feet flat on the floor. Use a footrest, if necessary, and keep your thighs parallel to the floor. Avoid rounding your shoulders, and avoid tilting your head forward.  When working at a desk or a computer, keep your desk at a height where your hands are slightly lower than your elbows. Slide your chair under your desk so you are close enough to maintain good posture.  When working at a computer, place your monitor at a height where you are looking straight ahead and you do not have to tilt your  head forward or downward to look at the screen. Resting   When lying down and resting, avoid positions that are most painful for you.  If you have pain with activities such as sitting, bending, stooping, or squatting (flexion-based activities), lie in a position in which your body does not bend very much. For example, avoid curling up on your side with your arms and knees near your chest (fetal position).  If you have pain with activities such as standing for a long time or reaching with your arms (extension-based activities), lie with your spine in a neutral position and bend your knees slightly. Try the following positions: ? Lying on your side with a pillow between your knees. ? Lying on your back with a pillow under your knees. Lifting   When lifting objects, keep your feet at least shoulder-width apart and tighten your abdominal muscles.  Bend your knees and hips and keep  your spine neutral. It is important to lift using the strength of your legs, not your back. Do not lock your knees straight out.  Always ask for help to lift heavy or awkward objects. This information is not intended to replace advice given to you by your health care provider. Make sure you discuss any questions you have with your health care provider. Document Released: 10/20/2005 Document Revised: 06/26/2016 Document Reviewed: 07/06/2015 Elsevier Interactive Patient Education  [employer].

## 2018-07-23 NOTE — Progress Notes (Signed)
Gloria [name] , 25-Jan-1956, 62 y.o., female MRN: [id number] Patient Care Team    Relationship Specialty Notifications Start End  Ma Hillock, DO PCP - General Family Medicine  08/20/15     Chief Complaint  Patient presents with  . leg numbness    intermittent starts at buttock radiates down her leg     Subjective: Pt presents for an OV with complaints of bilateral leg pain x3, since Sunday. She endorses the pain as quick, sharp few second pain that she describes as numb/tingling < 5 Seconds. Everytime sensation occurred she was walking and it quickly resolved with rest. She was seen at an UC and provided with robaxin and ibuprofen 800 mg. She denies back pain or injury. She denies saddle anesthesia or bladder/bowel dysfunction. Symptoms have not recurred sine Sunday. She has a h/o arthritis.  Pt has tried ibuprofen and robaxin  to ease their symptoms.   Depression screen PHQ 2/9 02/12/2018  Decreased Interest 0  Down, Depressed, Hopeless 0  PHQ - 2 Score 0    No Known Allergies Social History   Tobacco Use  . Smoking status: Never Smoker  . Smokeless tobacco: Never Used  Substance Use Topics  . Alcohol use: No    Alcohol/week: 0.0 standard drinks   Past Medical History:  Diagnosis Date  . Allergy   . High cholesterol   . Hypertension   . Seasonal allergies    Past Surgical History:  Procedure Laterality Date  . NO PAST SURGERIES     Family History  Problem Relation Age of Onset  . Heart disease Father   . Alcohol abuse Brother   . Kidney disease Brother    Allergies as of 07/23/2018   No Known Allergies     Medication List        Accurate as of 07/23/18  3:12 PM. Always use your most recent med list.          aspirin 81 MG chewable tablet Chew 81 mg by mouth. Reported on 12/14/2015   lisinopril-hydrochlorothiazide 10-12.5 MG tablet Commonly known as:  PRINZIDE,ZESTORETIC Take 1 tablet by mouth daily. Office visit needed for additional refills     methocarbamol 750 MG tablet Commonly known as:  ROBAXIN Take 750 mg by mouth 4 (four) times daily.       All past medical history, surgical history, allergies, family history, immunizations andmedications were updated in the EMR today and reviewed under the history and medication portions of their EMR.     ROS: Negative, with the exception of above mentioned in HPI   Objective:  BP 129/81 (BP Location: Right Arm, Patient Position: Sitting, Cuff Size: Large)   Pulse 64   Temp 98.9 F (37.2 C)   Resp 20   Ht 4\' 11"  (1.499 m)   Wt 156 lb 8 oz (71 kg)   SpO2 96%   BMI 31.61 kg/m  Body mass index is 31.61 kg/m. Gen: Afebrile. No acute distress. Nontoxic in appearance, well developed, well nourished.  HENT: AT. [place].  MMM, no oral lesions. Eyes:Pupils Equal Round Reactive to light, Extraocular movements intact,  Conjunctiva without redness, discharge or icterus. MSK (lumbar): no erythema, no bone TTP, FROM, neg SLR bilateral, neg FABRE bilateral, NV intact distally.  Neuro:  Normal gait. PERLA. EOMi. Alert. Oriented x3  Psych: Normal affect, dress and demeanor. Normal speech. Normal thought content and judgment.  No exam data present No results found. No results found for this or any previous  visit (from the past 24 hour(s)).  Assessment/Plan: Gloria [name] is a 62 y.o. female present for OV for  Bilateral sciatica Possible sciatica, however given it has occurred bilateral concerns her symptoms are from lumbar spine. It has not recurred since Sunday. No emergent sx.  - encouraged to continue diclofenac daily if able to tolerate for all arthritic pain (shoulder, knee and acute symptoms). She does not seem willing to restart, but will think about it, refills called in if she decides to start. - Sciatica stretches and strengthening provided to her today in written format, complete daily using pain as her guide.  - discussed emergent sym[toms and when to follow up or seek emergent tx.  Pr reports understanding. - use proper lifting mechanics.  - Diclofenac Sodium CR 100 MG 24 hr tablet; Take 1 tablet (100 mg total) by mouth daily.  Dispense: 90 tablet; Refill: 1 F/u prn  Reviewed expectations re: course of current medical issues.  Discussed self-management of symptoms.  Outlined signs and symptoms indicating need for more acute intervention.  Patient verbalized understanding and all questions were answered.  Patient received an After-Visit Summary.    No orders of the defined types were placed in this encounter.    Note is dictated utilizing voice recognition software. Although note has been proof read prior to signing, occasional typographical errors still can be missed. If any questions arise, please do not hesitate to call for verification.   electronically signed by:  Howard Pouch, DO  [place]

## 2018-07-26 ENCOUNTER — Other Ambulatory Visit: Payer: Self-pay | Admitting: *Deleted

## 2018-07-26 DIAGNOSIS — I10 Essential (primary) hypertension: Secondary | ICD-10-CM

## 2018-07-26 MED ORDER — LISINOPRIL-HYDROCHLOROTHIAZIDE 10-12.5 MG PO TABS: 1.0000 | ORAL_TABLET | Freq: Every day | ORAL | 0 refills | Status: DC

## 2018-08-18 ENCOUNTER — Other Ambulatory Visit: Payer: Self-pay | Admitting: Family Medicine

## 2018-08-18 DIAGNOSIS — I10 Essential (primary) hypertension: Secondary | ICD-10-CM

## 2018-08-31 ENCOUNTER — Other Ambulatory Visit: Payer: Self-pay | Admitting: Family Medicine

## 2018-08-31 DIAGNOSIS — I10 Essential (primary) hypertension: Secondary | ICD-10-CM

## 2018-09-08 ENCOUNTER — Other Ambulatory Visit: Payer: Self-pay | Admitting: Family Medicine

## 2018-09-08 DIAGNOSIS — I10 Essential (primary) hypertension: Secondary | ICD-10-CM

## 2018-09-08 NOTE — Telephone Encounter (Signed)
Requested medication (s) are due for refill today: yes  Requested medication (s) are on the active medication list: yes  Last refill:  07/26/18  #30  Daneil Dolin  Future visit scheduled: No  Notes to clinic:  Labs due    Requested Prescriptions  Pending Prescriptions Disp Refills   lisinopril-hydrochlorothiazide (PRINZIDE,ZESTORETIC) 10-12.5 MG tablet 30 tablet 0    Sig: Take 1 tablet by mouth daily. Office visit needed for additional refills     Cardiovascular:  ACEI + Diuretic Combos Failed - 09/08/2018  4:42 PM      Failed - Na in normal range and within 180 days    Sodium  Date Value Ref Range Status  12/14/2015 135 135 - 146 mmol/L Final         Failed - K in normal range and within 180 days    Potassium  Date Value Ref Range Status  12/14/2015 4.7 3.5 - 5.3 mmol/L Final         Failed - Cr in normal range and within 180 days    Creat  Date Value Ref Range Status  12/14/2015 0.63 0.50 - 0.99 mg/dL Final         Failed - Ca in normal range and within 180 days    Calcium  Date Value Ref Range Status  12/14/2015 9.4 8.6 - 10.4 mg/dL Final         Passed - Patient is not pregnant      Passed - Last BP in normal range    BP Readings from Last 1 Encounters:  07/23/18 129/81         Passed - Valid encounter within last 6 months    Recent Outpatient Visits          1 month ago Bilateral sciatica   [place], Renee A, DO   5 months ago Pain of foot, unspecified laterality   [place], Renee A, DO   6 months ago Cutaneous abscess of face   [place], Renee A, DO   1 year ago Granuloma of great toe   [place], Rule, DO   1 year ago Injury of right knee, subsequent encounter   [place], Renee A, DO

## 2018-09-08 NOTE — Telephone Encounter (Signed)
Copied from [place] [id number]. Topic: Quick Communication - Rx Refill/Question >> Sep 08, 2018  4:33 PM Yvette Rack wrote: Medication: lisinopril-hydrochlorothiazide (PRINZIDE,ZESTORETIC) 10-12.5 MG tablet  Has the patient contacted their pharmacy? yes   Preferred Pharmacy (with phone number or street name): [id number] - [place], [place] [phone] (Phone) [phone] (Fax)  Agent: Please be advised that RX refills may take up to 3 business days. We ask that you follow-up with your pharmacy.

## 2018-09-09 NOTE — Telephone Encounter (Signed)
Spoke with patient schedule appointment to be seen.

## 2018-09-09 NOTE — Telephone Encounter (Signed)
Patient was seen in September and would like to know can this be refilled based off of that or does she still need to come in for another appointment.

## 2018-09-10 ENCOUNTER — Ambulatory Visit: Payer: BLUE CROSS/BLUE SHIELD | Admitting: Family Medicine

## 2018-09-10 ENCOUNTER — Encounter: Payer: Self-pay | Admitting: Family Medicine

## 2018-09-10 VITALS — BP 142/74 | HR 61 | Temp 98.3°F | Resp 20 | Ht 59.0 in | Wt 153.0 lb

## 2018-09-10 DIAGNOSIS — I10 Essential (primary) hypertension: Secondary | ICD-10-CM

## 2018-09-10 MED ORDER — LISINOPRIL-HYDROCHLOROTHIAZIDE 10-12.5 MG PO TABS: 1.0000 | ORAL_TABLET | Freq: Every day | ORAL | 1 refills | Status: DC

## 2018-09-10 NOTE — Patient Instructions (Signed)
Please make an appt for your physical.   I have refilled your meds today. We will call you with lab results.  Please help [place] help you:  We are honored you have chosen [facility] for your [facility]. Below you will find basic instructions that you may need to access in the future. Please help [place] help you by reading the instructions, which cover many of the frequent questions we experience.   Prescription refills and request:  -In order to allow more efficient response time, please call your pharmacy for all refills. They will forward the request electronically to [place]. This allows for the quickest possible response. Request left on a nurse line can take longer to refill, since these are checked as time allows between office patients and other phone calls.  - refill request can take up to 3-5 working days to complete.  - If request is sent electronically and request is appropiate, it is usually completed in 1-2 business days.  - all patients will need to be seen routinely for all chronic medical conditions requiring prescription medications (see follow-up below). If you are overdue for follow up on your condition, you will be asked to make an appointment and we will call in enough medication to cover you until your appointment (up to 30 days).  - all controlled substances will require a face to face visit to request/refill.  - if you desire your prescriptions to go through a new pharmacy, and have an active script at original pharmacy, you will need to call your pharmacy and have scripts transferred to new pharmacy. This is completed between the pharmacy locations and not by your provider.    Results: If any images or labs were ordered, it can take up to 1 week to get results depending on the test ordered and the lab/facility running and resulting the test. - Normal or stable results, which do not need further discussion, may be released to your mychart immediately with attached note to  you. A call may not be generated for normal results. Please make certain to sign up for mychart. If you have questions on how to activate your mychart you can call the front office.  - If your results need further discussion, our office will attempt to contact you via phone, and if unable to reach you after 2 attempts, we will release your abnormal result to your mychart with instructions.  - All results will be automatically released in mychart after 1 week.  - Your provider will provide you with explanation and instruction on all relevant material in your results. Please keep in mind, results and labs may appear confusing or abnormal to the untrained eye, but it does not mean they are actually abnormal for you personally. If you have any questions about your results that are not covered, or you desire more detailed explanation than what was provided, you should make an appointment with your provider to do so.   Our office handles many outgoing and incoming calls daily. If we have not contacted you within 1 week about your results, please check your mychart to see if there is a message first and if not, then contact our office.  In helping with this matter, you help decrease call volume, and therefore allow [place] to be able to respond to patients needs more efficiently.   Acute office visits (sick visit):  An acute visit is intended for a new problem and are scheduled in shorter time slots to allow  schedule openings for patients with new problems. This is the appropriate visit to discuss a new problem. Problems will not be addressed by phone call or Echart message. Appointment is needed if requesting treatment. In order to provide you with excellent quality medical care with proper time for you to explain your problem, have an exam and receive treatment with instructions, these appointments should be limited to one new problem per visit. If you experience a new problem, in which you desire to be addressed,  please make an acute office visit, we save openings on the schedule to accommodate you. Please do not save your new problem for any other type of visit, let us take care of it properly and quickly for you.   Follow up visits:  Depending on your condition(s) your provider will need to see you routinely in order to provide you with quality care and prescribe medication(s). Most chronic conditions (Example: hypertension, Diabetes, depression/anxiety... etc), require visits a couple times a year. Your provider will instruct you on proper follow up for your personal medical conditions and history. Please make certain to make follow up appointments for your condition as instructed. Failing to do so could result in lapse in your medication treatment/refills. If you request a refill, and are overdue to be seen on a condition, we will always provide you with a 30 day script (once) to allow you time to schedule.    Medicare wellness (well visit): - we have a wonderful Nurse Maudie Mercury), that will meet with you and provide you will yearly medicare wellness visits. These visits should occur yearly (can not be scheduled less than 1 calendar year apart) and cover preventive health, immunizations, advance directives and screenings you are entitled to yearly through your medicare benefits. Do not miss out on your entitled benefits, this is when medicare will pay for these benefits to be ordered for you.  These are strongly encouraged by your provider and is the appropriate type of visit to make certain you are up to date with all preventive health benefits. If you have not had your medicare wellness exam in the last 12 months, please make certain to schedule one by calling the office and schedule your medicare wellness with Maudie Mercury as soon as possible.   Yearly physical (well visit):  - Adults are recommended to be seen yearly for physicals. Check with your insurance and date of your last physical, most insurances require one  calendar year between physicals. Physicals include all preventive health topics, screenings, medical exam and labs that are appropriate for gender/age and history. You may have fasting labs needed at this visit. This is a well visit (not a sick visit), new problems should not be covered during this visit (see acute visit).  - Pediatric patients are seen more frequently when they are younger. Your provider will advise you on well child visit timing that is appropriate for your their age. - This is not a medicare wellness visit. Medicare wellness exams do not have an exam portion to the visit. Some medicare companies allow for a physical, some do not allow a yearly physical. If your medicare allows a yearly physical you can schedule the medicare wellness with our nurse Maudie Mercury and have your physical with your provider after, on the same day. Please check with insurance for your full benefits.   Late Policy/No Shows:  - all new patients should arrive 15-30 minutes earlier than appointment to allow [place] time  to  obtain all personal demographics,  insurance  information and for you to complete office paperwork. - All established patients should arrive 10-15 minutes earlier than appointment time to update all information and be checked in .  - In our best efforts to run on time, if you are late for your appointment you will be asked to either reschedule or if able, we will work you back into the schedule. There will be a wait time to work you back in the schedule,  depending on availability.  - If you are unable to make it to your appointment as scheduled, please call 24 hours ahead of time to allow [place] to fill the time slot with someone else who needs to be seen. If you do not cancel your appointment ahead of time, you may be charged a no show fee.  Hypertension Hypertension, commonly called high blood pressure, is when the force of blood pumping through the arteries is too strong. The arteries are the blood vessels  that carry blood from the heart throughout the body. Hypertension forces the heart to work harder to pump blood and may cause arteries to become narrow or stiff. Having untreated or uncontrolled hypertension can cause heart attacks, strokes, kidney disease, and other problems. A blood pressure reading consists of a higher number over a lower number. Ideally, your blood pressure should be below 120/80. The first ("top") number is called the systolic pressure. It is a measure of the pressure in your arteries as your heart beats. The second ("bottom") number is called the diastolic pressure. It is a measure of the pressure in your arteries as the heart relaxes. What are the causes? The cause of this condition is not known. What increases the risk? Some risk factors for high blood pressure are under your control. Others are not. Factors you can change  Smoking.  Having type 2 diabetes mellitus, high cholesterol, or both.  Not getting enough exercise or physical activity.  Being overweight.  Having too much fat, sugar, calories, or salt (sodium) in your diet.  Drinking too much alcohol. Factors that are difficult or impossible to change  Having chronic kidney disease.  Having a family history of high blood pressure.  Age. Risk increases with age.  Race. You may be at higher risk if you are African-American.  Gender. Men are at higher risk than women before age 46. After age 72, women are at higher risk than men.  Having obstructive sleep apnea.  Stress. What are the signs or symptoms? Extremely high blood pressure (hypertensive crisis) may cause:  Headache.  Anxiety.  Shortness of breath.  Nosebleed.  Nausea and vomiting.  Severe chest pain.  Jerky movements you cannot control (seizures).  How is this diagnosed? This condition is diagnosed by measuring your blood pressure while you are seated, with your arm resting on a surface. The cuff of the blood pressure monitor  will be placed directly against the skin of your upper arm at the level of your heart. It should be measured at least twice using the same arm. Certain conditions can cause a difference in blood pressure between your right and left arms. Certain factors can cause blood pressure readings to be lower or higher than normal (elevated) for a short period of time:  When your blood pressure is higher when you are in a health care provider's office than when you are at home, this is called white coat hypertension. Most people with this condition do not need medicines.  When your blood pressure is higher at home  than when you are in a health care provider's office, this is called masked hypertension. Most people with this condition may need medicines to control blood pressure.  If you have a high blood pressure reading during one visit or you have normal blood pressure with other risk factors:  You may be asked to return on a different day to have your blood pressure checked again.  You may be asked to monitor your blood pressure at home for 1 week or longer.  If you are diagnosed with hypertension, you may have other blood or imaging tests to help your health care provider understand your overall risk for other conditions. How is this treated? This condition is treated by making healthy lifestyle changes, such as eating healthy foods, exercising more, and reducing your alcohol intake. Your health care provider may prescribe medicine if lifestyle changes are not enough to get your blood pressure under control, and if:  Your systolic blood pressure is above 130.  Your diastolic blood pressure is above 80.  Your personal target blood pressure may vary depending on your medical conditions, your age, and other factors. Follow these instructions at home: Eating and drinking  Eat a diet that is high in fiber and potassium, and low in sodium, added sugar, and fat. An example eating plan is called the DASH  (Dietary Approaches to Stop Hypertension) diet. To eat this way: ? Eat plenty of fresh fruits and vegetables. Try to fill half of your plate at each meal with fruits and vegetables. ? Eat whole grains, such as whole wheat pasta, brown rice, or whole grain bread. Fill about one quarter of your plate with whole grains. ? Eat or drink low-fat dairy products, such as skim milk or low-fat yogurt. ? Avoid fatty cuts of meat, processed or cured meats, and poultry with skin. Fill about one quarter of your plate with lean proteins, such as fish, chicken without skin, beans, eggs, and tofu. ? Avoid premade and processed foods. These tend to be higher in sodium, added sugar, and fat.  Reduce your daily sodium intake. Most people with hypertension should eat less than 1,500 mg of sodium a day.  Limit alcohol intake to no more than 1 drink a day for nonpregnant women and 2 drinks a day for men. One drink equals 12 oz of beer, 5 oz of wine, or 1 oz of hard liquor. Lifestyle  Work with your health care provider to maintain a healthy body weight or to lose weight. Ask what an ideal weight is for you.  Get at least 30 minutes of exercise that causes your heart to beat faster (aerobic exercise) most days of the week. Activities may include walking, swimming, or biking.  Include exercise to strengthen your muscles (resistance exercise), such as pilates or lifting weights, as part of your weekly exercise routine. Try to do these types of exercises for 30 minutes at least 3 days a week.  Do not use any products that contain nicotine or tobacco, such as cigarettes and e-cigarettes. If you need help quitting, ask your health care provider.  Monitor your blood pressure at home as told by your health care provider.  Keep all follow-up visits as told by your health care provider. This is important. Medicines  Take over-the-counter and prescription medicines only as told by your health care provider. Follow  directions carefully. Blood pressure medicines must be taken as prescribed.  Do not skip doses of blood pressure medicine. Doing this puts you at risk  for problems and can make the medicine less effective.  Ask your health care provider about side effects or reactions to medicines that you should watch for. Contact a health care provider if:  You think you are having a reaction to a medicine you are taking.  You have headaches that keep coming back (recurring).  You feel dizzy.  You have swelling in your ankles.  You have trouble with your vision. Get help right away if:  You develop a severe headache or confusion.  You have unusual weakness or numbness.  You feel faint.  You have severe pain in your chest or abdomen.  You vomit repeatedly.  You have trouble breathing. Summary  Hypertension is when the force of blood pumping through your arteries is too strong. If this condition is not controlled, it may put you at risk for serious complications.  Your personal target blood pressure may vary depending on your medical conditions, your age, and other factors. For most people, a normal blood pressure is less than 120/80.  Hypertension is treated with lifestyle changes, medicines, or a combination of both. Lifestyle changes include weight loss, eating a healthy, low-sodium diet, exercising more, and limiting alcohol. This information is not intended to replace advice given to you by your health care provider. Make sure you discuss any questions you have with your health care provider. Document Released: 10/20/2005 Document Revised: 09/17/2016 Document Reviewed: 09/17/2016 Elsevier Interactive Patient Education  [employer].

## 2018-09-10 NOTE — Progress Notes (Signed)
Gloria [name] , Jul 25, 1956, 62 y.o., female MRN: [id number] Patient Care Team    Relationship Specialty Notifications Start End  Ma Hillock, DO PCP - General Family Medicine  08/20/15     Chief Complaint  Patient presents with  . Hypertension     Subjective: Hypertension: Pt reports compliance with Lisinopril/HCTZ 10-12.5 mg daily- she ran out yessterday. Patient denies chest pain, shortness of breath, dizziness or lower extremity edema. Last BMP 2017 normal.   Depression screen [facility] 2/9 07/23/2018 02/12/2018  Decreased Interest 0 0  Down, Depressed, Hopeless 0 0  PHQ - 2 Score 0 0    No Known Allergies Social History   Tobacco Use  . Smoking status: Never Smoker  . Smokeless tobacco: Never Used  Substance Use Topics  . Alcohol use: No    Alcohol/week: 0.0 standard drinks   Past Medical History:  Diagnosis Date  . Allergy   . High cholesterol   . Hypertension   . Seasonal allergies    Past Surgical History:  Procedure Laterality Date  . NO PAST SURGERIES     Family History  Problem Relation Age of Onset  . Heart disease Father   . Alcohol abuse Brother   . Kidney disease Brother    Allergies as of 09/10/2018   No Known Allergies     Medication List        Accurate as of 09/10/18  9:33 AM. Always use your most recent med list.          aspirin 81 MG chewable tablet Chew 81 mg by mouth. Reported on 12/14/2015   lisinopril-hydrochlorothiazide 10-12.5 MG tablet Commonly known as:  PRINZIDE,ZESTORETIC Take 1 tablet by mouth daily. Office visit needed for additional refills       All past medical history, surgical history, allergies, family history, immunizations andmedications were updated in the EMR today and reviewed under the history and medication portions of their EMR.     ROS: Negative, with the exception of above mentioned in HPI   Objective:  BP (!) 142/74 (BP Location: Right Arm, Patient Position: Sitting, Cuff Size: Large)   Pulse 61    Temp 98.3 F (36.8 C)   Resp 20   Ht 4' 11"  (1.499 m)   Wt 153 lb (69.4 kg)   SpO2 98%   BMI 30.90 kg/m  Body mass index is 30.9 kg/m. Gen: Afebrile. No acute distress. Nontoxic in appearance, well developed, well nourished.  HENT: AT. [place].  MMM Eyes:Pupils Equal Round Reactive to light, Extraocular movements intact,  Conjunctiva without redness, discharge or icterus. Neck/lymp/endocrine: Supple,no lymphadenopathy CV: RRR no murmur, no edema Chest: CTAB, no wheeze or crackles. Good air movement, normal resp effort.   Neuro: Normal gait. PERLA. EOMi. Alert. Oriented x3   No exam data present No results found. No results found for this or any previous visit (from the past 24 hour(s)).  Assessment/Plan: Gloria [name] is a 62 y.o. female present for OV for  Essential hypertension - stable on med. Refilled lisinopril-hctz 10-12.5 today.  - Comp Met (CMET) - diet and exercise recommendations.  - f/u 6 mons.    Pt encouraged to make CPE so we can get the rest of her labs.    Reviewed expectations re: course of current medical issues.  Discussed self-management of symptoms.  Outlined signs and symptoms indicating need for more acute intervention.  Patient verbalized understanding and all questions were answered.  Patient received an After-Visit Summary.  No orders of the defined types were placed in this encounter.    Note is dictated utilizing voice recognition software. Although note has been proof read prior to signing, occasional typographical errors still can be missed. If any questions arise, please do not hesitate to call for verification.   electronically signed by:  Howard Pouch, DO  [facility]

## 2018-09-24 ENCOUNTER — Ambulatory Visit (INDEPENDENT_AMBULATORY_CARE_PROVIDER_SITE_OTHER): Payer: BLUE CROSS/BLUE SHIELD | Admitting: Family Medicine

## 2018-09-24 ENCOUNTER — Encounter: Payer: Self-pay | Admitting: Family Medicine

## 2018-09-24 VITALS — BP 122/74 | HR 63 | Temp 98.2°F | Resp 20 | Ht 59.0 in | Wt 153.0 lb

## 2018-09-24 DIAGNOSIS — R7303 Prediabetes: Secondary | ICD-10-CM

## 2018-09-24 DIAGNOSIS — Z1159 Encounter for screening for other viral diseases: Secondary | ICD-10-CM | POA: Diagnosis not present

## 2018-09-24 DIAGNOSIS — Z114 Encounter for screening for human immunodeficiency virus [HIV]: Secondary | ICD-10-CM | POA: Diagnosis not present

## 2018-09-24 DIAGNOSIS — E559 Vitamin D deficiency, unspecified: Secondary | ICD-10-CM | POA: Diagnosis not present

## 2018-09-24 DIAGNOSIS — Z01419 Encounter for gynecological examination (general) (routine) without abnormal findings: Secondary | ICD-10-CM

## 2018-09-24 DIAGNOSIS — I10 Essential (primary) hypertension: Secondary | ICD-10-CM | POA: Diagnosis not present

## 2018-09-24 DIAGNOSIS — Z1211 Encounter for screening for malignant neoplasm of colon: Secondary | ICD-10-CM

## 2018-09-24 DIAGNOSIS — Z0000 Encounter for general adult medical examination without abnormal findings: Secondary | ICD-10-CM | POA: Diagnosis not present

## 2018-09-24 DIAGNOSIS — Z79899 Other long term (current) drug therapy: Secondary | ICD-10-CM | POA: Diagnosis not present

## 2018-09-24 DIAGNOSIS — E669 Obesity, unspecified: Secondary | ICD-10-CM

## 2018-09-24 DIAGNOSIS — E2839 Other primary ovarian failure: Secondary | ICD-10-CM

## 2018-09-24 DIAGNOSIS — Z23 Encounter for immunization: Secondary | ICD-10-CM | POA: Diagnosis not present

## 2018-09-24 DIAGNOSIS — Z1239 Encounter for other screening for malignant neoplasm of breast: Secondary | ICD-10-CM

## 2018-09-24 LAB — COMPREHENSIVE METABOLIC PANEL
ALK PHOS: 57 U/L (ref 39–117)
ALT: 18 U/L (ref 0–35)
AST: 16 U/L (ref 0–37)
Albumin: 4.6 g/dL (ref 3.5–5.2)
BILIRUBIN TOTAL: 0.6 mg/dL (ref 0.2–1.2)
BUN: 14 mg/dL (ref 6–23)
CALCIUM: 10 mg/dL (ref 8.4–10.5)
CO2: 30 meq/L (ref 19–32)
Chloride: 99 mEq/L (ref 96–112)
Creatinine, Ser: 0.72 mg/dL (ref 0.40–1.20)
GFR: 86.87 mL/min (ref >60.00)
Glucose, Bld: 101 mg/dL — ABNORMAL HIGH (ref 70–99)
Potassium: 4.8 mEq/L (ref 3.5–5.1)
Sodium: 137 mEq/L (ref 135–145)
Total Protein: 7.4 g/dL (ref 6.0–8.3)

## 2018-09-24 LAB — VITAMIN D 25 HYDROXY (VIT D DEFICIENCY, FRACTURES): VITD: 35.44 ng/mL (ref 30.00–100.00)

## 2018-09-24 LAB — CBC WITH DIFFERENTIAL/PLATELET
BASOS ABS: 0.1 10*3/uL (ref 0.0–0.1)
BASOS PCT: 1.1 % (ref 0.0–3.0)
EOS PCT: 5.5 % — AB (ref 0.0–5.0)
Eosinophils Absolute: 0.4 10*3/uL (ref 0.0–0.7)
HEMATOCRIT: 38.6 % (ref 36.0–46.0)
Hemoglobin: 12.9 g/dL (ref 12.0–15.0)
LYMPHS PCT: 26.3 % (ref 12.0–46.0)
Lymphs Abs: 1.8 10*3/uL (ref 0.7–4.0)
MCHC: 33.5 g/dL (ref 30.0–36.0)
MCV: 88.5 fl (ref 78.0–100.0)
MONOS PCT: 6.4 % (ref 3.0–12.0)
Monocytes Absolute: 0.4 10*3/uL (ref 0.1–1.0)
NEUTROS ABS: 4.2 10*3/uL (ref 1.4–7.7)
Neutrophils Relative %: 60.7 % (ref 43.0–77.0)
PLATELETS: 264 10*3/uL (ref 150.0–400.0)
RBC: 4.36 Mil/uL (ref 3.87–5.11)
RDW: 12.7 % (ref 11.5–15.5)
WBC: 6.9 10*3/uL (ref 4.0–10.5)

## 2018-09-24 LAB — LIPID PANEL
Cholesterol: 283 mg/dL — ABNORMAL HIGH (ref 0–200)
HDL: 47.9 mg/dL (ref >39.00)
NONHDL: 235.05
TRIGLYCERIDES: 230 mg/dL — AB (ref 0.0–149.0)
Total CHOL/HDL Ratio: 6
VLDL: 46 mg/dL — ABNORMAL HIGH (ref 0.0–40.0)

## 2018-09-24 LAB — TSH: TSH: 1.2 u[IU]/mL (ref 0.35–4.50)

## 2018-09-24 LAB — LDL CHOLESTEROL, DIRECT: LDL DIRECT: 191 mg/dL

## 2018-09-24 LAB — HEMOGLOBIN A1C: Hgb A1c MFr Bld: 6.3 % (ref 4.6–6.5)

## 2018-09-24 NOTE — Progress Notes (Signed)
Patient ID: Gloria [name], female  DOB: [birth year], 62 y.o.   MRN: [id number] Patient Care Team    Relationship Specialty Notifications Start End  Ma Hillock, DO PCP - General Family Medicine  08/20/15     Chief Complaint  Patient presents with  . Annual Exam    Subjective:  Gloria [name] is a 62 y.o.  Female  present for CPE All past medical history, surgical history, allergies, family history, immunizations, medications and social history were updated in the electronic medical record today. All recent labs, ED visits and hospitalizations within the last year were reviewed.  Health maintenance:  Colonoscopy: never, no fhx. Referred today Mammogram: no fhx, completed:2017, Normal -- ordered Breast center Cervical cancer screening: last pap: 2010. Encouraged her to schedule with her gyn, placed referral for her today Immunizations: tdap updated today 09/2018, Influenza UTD 08/2018 (encouraged yearly),  Shingrix #1 today Infectious disease screening: HIV and  Hep C completed today DEXA: never, ordered at breast center Assistive device: none Oxygen YCX:KGYJ Patient has a Dental home. Hospitalizations/ED visits: reviewed   Depression screen [facility] 2/9 09/24/2018 07/23/2018 02/12/2018  Decreased Interest 0 0 0  Down, Depressed, Hopeless 0 0 0  PHQ - 2 Score 0 0 0   No flowsheet data found.   Current Exercise Habits: Home exercise routine, Type of exercise: calisthenics;strength training/weights, Time (Minutes): 30, Frequency (Times/Week): 7, Weekly Exercise (Minutes/Week): 210, Intensity: Mild     Immunization History  Administered Date(s) Administered  . Influenza-Unspecified 10/03/2014, 09/04/2015, 08/17/2018  . Td 11/04/2007  . Tdap 09/24/2018  . Zoster Recombinat (Shingrix) 09/24/2018    Past Medical History:  Diagnosis Date  . Allergy   . High cholesterol   . Hypertension   . Seasonal allergies    No Known Allergies Past Surgical History:  Procedure  Laterality Date  . NO PAST SURGERIES     Family History  Problem Relation Age of Onset  . Heart disease Father   . Alcohol abuse Brother   . Kidney disease Brother    Social History   Socioeconomic History  . Marital status: Married    Spouse name: Not on file  . Number of children: 1  . Years of education: 4 yr [place]  . Highest education level: Not on file  Occupational History  . 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 Financial resource strain: Not on file  . Food insecurity:    Worry: Not on file    Inability: Not on file  . Transportation needs:    Medical: Not on file    Non-medical: Not on file  Tobacco Use  . Smoking status: Never Smoker  . Smokeless tobacco: Never Used  Substance and Sexual Activity  . Alcohol use: No    Alcohol/week: 0.0 standard drinks  . Drug use: No  . Sexual activity: Yes    Birth control/protection: Post-menopausal  Lifestyle  . Physical activity:    Days per week: Not on file    Minutes per session: Not on file  . Stress: Not on file  Relationships  . Social connections:    Talks on phone: Not on file    Gets together: Not on file    Attends religious service: Not on file    Active member of club or organization: Not on file    Attends meetings of clubs or organizations: Not on file    Relationship status:  Not on file  . Intimate partner violence:    Fear of current or ex partner: Not on file    Emotionally abused: Not on file    Physically abused: Not on file    Forced sexual activity: Not on file  Other Topics Concern  . Not on file  Social History Narrative   Ms Fouty is married.  She lives with husband & adopted daughter. She works FT at [employer].   Smoke detector in the home, wears her seatbelt.    Allergies as of 09/24/2018   No Known Allergies     Medication List        Accurate as of 09/24/18 12:11 PM. Always use your most recent med list.           aspirin 81 MG chewable tablet Chew 81 mg by mouth. Reported on 12/14/2015   lisinopril-hydrochlorothiazide 10-12.5 MG tablet Commonly known as:  PRINZIDE,ZESTORETIC Take 1 tablet by mouth daily. Office visit needed for additional refills       All past medical history, surgical history, allergies, family history, immunizations andmedications were updated in the EMR today and reviewed under the history and medication portions of their EMR.     No results found for this or any previous visit (from the past 2160 hour(s)).   ROS: 14 pt review of systems performed and negative (unless mentioned in an HPI)  Objective: BP 122/74 (BP Location: Right Arm, Patient Position: Sitting, Cuff Size: Normal)   Pulse 63   Temp 98.2 F (36.8 C) (Oral)   Resp 20   Ht 4\' 11"  (1.499 m)   Wt 153 lb (69.4 kg)   SpO2 97%   BMI 30.90 kg/m  Gen: Afebrile. No acute distress. Nontoxic in appearance, well-developed, well-nourished,  Obese female.  HENT: AT. [place]. Bilateral TM visualized and normal in appearance, normal external auditory canal. MMM, no oral lesions, adequate dentition. Bilateral nares within normal limits. Throat without erythema, ulcerations or exudates. no Cough on exam, no hoarseness on exam. Eyes:Pupils Equal Round Reactive to light, Extraocular movements intact,  Conjunctiva without redness, discharge or icterus. Neck/lymp/endocrine: Supple,no lymphadenopathy, no thyromegaly CV: RRR no murmur, no edema, +2/4 P posterior tibialis pulses. no carotid bruits. No JVD. Chest: CTAB, no wheeze, rhonchi or crackles. normal Respiratory effort. good Air movement. Abd: Soft. flat. NTND. BS present. no Masses palpated. No hepatosplenomegaly. No rebound tenderness or guarding. Skin: no rashes, purpura or petechiae. Warm and well-perfused. Skin intact. Neuro/Msk:  Normal gait. PERLA. EOMi. Alert. Oriented x3.  Cranial nerves II through XII intact. Muscle strength 5/5 upper/lower extremity. DTRs equal  bilaterally. Psych: Normal affect, dress and demeanor. Normal speech. Normal thought content and judgment.   No exam data present  Assessment/plan: Gloria [name] is a 62 y.o. female present for CPE.   Patient was encouraged to exercise greater than 150 minutes a week. Patient was encouraged to choose a diet filled with fresh fruits and vegetables, and lean meats. AVS provided to patient today for education/recommendation on gender specific health and safety maintenance. Colonoscopy: never, no fhx. Referred today Mammogram: no fhx, completed:2017, Normal -- ordered Breast center Cervical cancer screening: last pap: 2010. Encouraged her to schedule with her gyn, placed referral for her today Immunizations: tdap updated today 09/2018, Influenza UTD 08/2018 (encouraged yearly),  Shingrix #1 today Infectious disease screening: HIV and  Hep C completed today DEXA: never, ordered at breast center   Return in about 1 year (around 09/25/2019) for CPE.  Electronically  signed by: Howard Pouch, DO [place]

## 2018-09-24 NOTE — Patient Instructions (Signed)
3 mos nurse visit for Shingrix #2 6 mos provider for [facility] Mam and dexa ordered.  GAstro and GYN referral placed-- they will call to schedule these.   Health Maintenance, Female Adopting a healthy lifestyle and getting preventive care can go a long way to promote health and wellness. Talk with your health care provider about what schedule of regular examinations is right for you. This is a good chance for you to check in with your provider about disease prevention and staying healthy. In between checkups, there are plenty of things you can do on your own. Experts have done a lot of research about which lifestyle changes and preventive measures are most likely to keep you healthy. Ask your health care provider for more information. Weight and diet Eat a healthy diet  Be sure to include plenty of vegetables, fruits, low-fat dairy products, and lean protein.  Do not eat a lot of foods high in solid fats, added sugars, or salt.  Get regular exercise. This is one of the most important things you can do for your health. ? Most adults should exercise for at least 150 minutes each week. The exercise should increase your heart rate and make you sweat (moderate-intensity exercise). ? Most adults should also do strengthening exercises at least twice a week. This is in addition to the moderate-intensity exercise.  Maintain a healthy weight  Body mass index (BMI) is a measurement that can be used to identify possible weight problems. It estimates body fat based on height and weight. Your health care provider can help determine your BMI and help you achieve or maintain a healthy weight.  For females 49 years of age and older: ? A BMI below 18.5 is considered underweight. ? A BMI of 18.5 to 24.9 is normal. ? A BMI of 25 to 29.9 is considered overweight. ? A BMI of 30 and above is considered obese.  Watch levels of cholesterol and blood lipids  You should start having your blood tested for lipids and  cholesterol at 63 years of age, then have this test every 5 years.  You may need to have your cholesterol levels checked more often if: ? Your lipid or cholesterol levels are high. ? You are older than 63 years of age. ? You are at high risk for heart disease.  Cancer screening Lung Cancer  Lung cancer screening is recommended for adults 88-66 years old who are at high risk for lung cancer because of a history of smoking.  A yearly low-dose CT scan of the lungs is recommended for people who: ? Currently smoke. ? Have quit within the past 15 years. ? Have at least a 30-pack-year history of smoking. A pack year is smoking an average of one pack of cigarettes a day for 1 year.  Yearly screening should continue until it has been 15 years since you quit.  Yearly screening should stop if you develop a health problem that would prevent you from having lung cancer treatment.  Breast Cancer  Practice breast self-awareness. This means understanding how your breasts normally appear and feel.  It also means doing regular breast self-exams. Let your health care provider know about any changes, no matter how small.  If you are in your 20s or 30s, you should have a clinical breast exam (CBE) by a health care provider every 1-3 years as part of a regular health exam.  If you are 89 or older, have a CBE every year. Also consider having a breast  X-ray (mammogram) every year.  If you have a family history of breast cancer, talk to your health care provider about genetic screening.  If you are at high risk for breast cancer, talk to your health care provider about having an MRI and a mammogram every year.  Breast cancer gene (BRCA) assessment is recommended for women who have family members with BRCA-related cancers. BRCA-related cancers include: ? Breast. ? Ovarian. ? Tubal. ? Peritoneal cancers.  Results of the assessment will determine the need for genetic counseling and BRCA1 and BRCA2  testing.  Cervical Cancer Your health care provider may recommend that you be screened regularly for cancer of the pelvic organs (ovaries, uterus, and vagina). This screening involves a pelvic examination, including checking for microscopic changes to the surface of your cervix (Pap test). You may be encouraged to have this screening done every 3 years, beginning at age 10.  For women ages 89-65, health care providers may recommend pelvic exams and Pap testing every 3 years, or they may recommend the Pap and pelvic exam, combined with testing for human papilloma virus (HPV), every 5 years. Some types of HPV increase your risk of cervical cancer. Testing for HPV may also be done on women of any age with unclear Pap test results.  Other health care providers may not recommend any screening for nonpregnant women who are considered low risk for pelvic cancer and who do not have symptoms. Ask your health care provider if a screening pelvic exam is right for you.  If you have had past treatment for cervical cancer or a condition that could lead to cancer, you need Pap tests and screening for cancer for at least 20 years after your treatment. If Pap tests have been discontinued, your risk factors (such as having a new sexual partner) need to be reassessed to determine if screening should resume. Some women have medical problems that increase the chance of getting cervical cancer. In these cases, your health care provider may recommend more frequent screening and Pap tests.  Colorectal Cancer  This type of cancer can be detected and often prevented.  Routine colorectal cancer screening usually begins at 63 years of age and continues through 63 years of age.  Your health care provider may recommend screening at an earlier age if you have risk factors for colon cancer.  Your health care provider may also recommend using home test kits to check for hidden blood in the stool.  A small camera at the end of a  tube can be used to examine your colon directly (sigmoidoscopy or colonoscopy). This is done to check for the earliest forms of colorectal cancer.  Routine screening usually begins at age 8.  Direct examination of the colon should be repeated every 5-10 years through 63 years of age. However, you may need to be screened more often if early forms of precancerous polyps or small growths are found.  Skin Cancer  Check your skin from head to toe regularly.  Tell your health care provider about any new moles or changes in moles, especially if there is a change in a mole's shape or color.  Also tell your health care provider if you have a mole that is larger than the size of a pencil eraser.  Always use sunscreen. Apply sunscreen liberally and repeatedly throughout the day.  Protect yourself by wearing long sleeves, pants, a wide-brimmed hat, and sunglasses whenever you are outside.  Heart disease, diabetes, and high blood pressure  High blood  pressure causes heart disease and increases the risk of stroke. High blood pressure is more likely to develop in: ? People who have blood pressure in the high end of the normal range (130-139/85-89 mm Hg). ? People who are overweight or obese. ? People who are African American.  If you are 63-73 years of age, have your blood pressure checked every 3-5 years. If you are 41 years of age or older, have your blood pressure checked every year. You should have your blood pressure measured twice-once when you are at a hospital or clinic, and once when you are not at a hospital or clinic. Record the average of the two measurements. To check your blood pressure when you are not at a hospital or clinic, you can use: ? An automated blood pressure machine at a pharmacy. ? A home blood pressure monitor.  If you are between 82 years and 67 years old, ask your health care provider if you should take aspirin to prevent strokes.  Have regular diabetes screenings. This  involves taking a blood sample to check your fasting blood sugar level. ? If you are at a normal weight and have a low risk for diabetes, have this test once every three years after 63 years of age. ? If you are overweight and have a high risk for diabetes, consider being tested at a younger age or more often. Preventing infection Hepatitis B  If you have a higher risk for hepatitis B, you should be screened for this virus. You are considered at high risk for hepatitis B if: ? You were born in a country where hepatitis B is common. Ask your health care provider which countries are considered high risk. ? Your parents were born in a high-risk country, and you have not been immunized against hepatitis B (hepatitis B vaccine). ? You have HIV or AIDS. ? You use needles to inject street drugs. ? You live with someone who has hepatitis B. ? You have had sex with someone who has hepatitis B. ? You get hemodialysis treatment. ? You take certain medicines for conditions, including cancer, organ transplantation, and autoimmune conditions.  Hepatitis C  Blood testing is recommended for: ? Everyone born from [birth year] through 1966. ? Anyone with known risk factors for hepatitis C.  Sexually transmitted infections (STIs)  You should be screened for sexually transmitted infections (STIs) including gonorrhea and chlamydia if: ? You are sexually active and are younger than 63 years of age. ? You are older than 63 years of age and your health care provider tells you that you are at risk for this type of infection. ? Your sexual activity has changed since you were last screened and you are at an increased risk for chlamydia or gonorrhea. Ask your health care provider if you are at risk.  If you do not have HIV, but are at risk, it may be recommended that you take a prescription medicine daily to prevent HIV infection. This is called pre-exposure prophylaxis (PrEP). You are considered at risk if: ? You are  sexually active and do not regularly use condoms or know the HIV status of your partner(s). ? You take drugs by injection. ? You are sexually active with a partner who has HIV.  Talk with your health care provider about whether you are at high risk of being infected with HIV. If you choose to begin PrEP, you should first be tested for HIV. You should then be tested every 3 months for as  long as you are taking PrEP. Pregnancy  If you are premenopausal and you may become pregnant, ask your health care provider about preconception counseling.  If you may become pregnant, take 400 to 800 micrograms (mcg) of folic acid every day.  If you want to prevent pregnancy, talk to your health care provider about birth control (contraception). Osteoporosis and menopause  Osteoporosis is a disease in which the bones lose minerals and strength with aging. This can result in serious bone fractures. Your risk for osteoporosis can be identified using a bone density scan.  If you are 48 years of age or older, or if you are at risk for osteoporosis and fractures, ask your health care provider if you should be screened.  Ask your health care provider whether you should take a calcium or vitamin D supplement to lower your risk for osteoporosis.  Menopause may have certain physical symptoms and risks.  Hormone replacement therapy may reduce some of these symptoms and risks. Talk to your health care provider about whether hormone replacement therapy is right for you. Follow these instructions at home:  Schedule regular health, dental, and eye exams.  Stay current with your immunizations.  Do not use any tobacco products including cigarettes, chewing tobacco, or electronic cigarettes.  If you are pregnant, do not drink alcohol.  If you are breastfeeding, limit how much and how often you drink alcohol.  Limit alcohol intake to no more than 1 drink per day for nonpregnant women. One drink equals 12 ounces of  beer, 5 ounces of wine, or 1 ounces of hard liquor.  Do not use street drugs.  Do not share needles.  Ask your health care provider for help if you need support or information about quitting drugs.  Tell your health care provider if you often feel depressed.  Tell your health care provider if you have ever been abused or do not feel safe at home. This information is not intended to replace advice given to you by your health care provider. Make sure you discuss any questions you have with your health care provider. Document Released: 05/05/2011 Document Revised: 03/27/2016 Document Reviewed: 07/24/2015 Elsevier Interactive Patient Education  [employer].

## 2018-09-25 LAB — HEPATITIS C ANTIBODY
Hepatitis C Ab: NONREACTIVE
SIGNAL TO CUT-OFF: 0.01 (ref <1.00)

## 2018-09-25 LAB — HIV ANTIBODY (ROUTINE TESTING W REFLEX): HIV 1&2 Ab, 4th Generation: NONREACTIVE

## 2018-09-27 ENCOUNTER — Telehealth: Payer: Self-pay | Admitting: Family Medicine

## 2018-09-27 DIAGNOSIS — E785 Hyperlipidemia, unspecified: Secondary | ICD-10-CM | POA: Insufficient documentation

## 2018-09-27 MED ORDER — ATORVASTATIN CALCIUM 40 MG PO TABS: 40.0000 mg | ORAL_TABLET | Freq: Every day | ORAL | 3 refills | Status: DC

## 2018-09-27 NOTE — Telephone Encounter (Signed)
Patient notified and requested a letter with the information be sent to her and her husband so he could look over information. Letter printed and mailed.

## 2018-09-27 NOTE — Telephone Encounter (Signed)
Please inform patient the following information: Her labs are all normal with wit the following exceptions-   - Her diabetes screen is elevated to prediabetes range at  6.3 (A1c) - above 6.4% is consider a diabetic. No medications needed at this time- diet low in sugar and carbohydrates, more exercise- and close monitoring to make certain she does not progress to DM.    - Her cholesterol is very high. total cholesterol 283, good cholesterol (HDL) 47, bad cholesterol (LDL) 191 - goal <130. Her CV risk score is 15.6%, which is her risk for heart attack or stroke within the next 10 years.   CV risk score is calculated by her specific age, BP, cholesterol level and medical history. American heart association recommendations anyone with a LDL > 190 or a CV risk > 7.5% be started on a statin medication to both lower cholesterol and provided the added benefit of helping stabilizing the blood vessel walls to prevent stroke/heart attack- this benefit is only provided by a statin med. She has both risk by calculation.   - of course routine exercise, diet lower in saturated/trans fats and higher fiber can also be helpful. Fish oil/omega- 3 helps lower triglycerides and elevated the good cholesterol, but does not help lower bad cholesterol or CV risk.    I have called in the medication to provide her CV protection (lipitor) to the pharmacy-- and please schedule the patient for 3 mos for recheck with provider and she will have fasting labs as well.

## 2018-10-30 IMAGING — DX DG KNEE COMPLETE 4+V*R*
4 series · 4 of 4 positions shown · non-contrast
Comparison: None.

CLINICAL DATA: 60-year-old female with a history of knee pain

EXAM:
RIGHT KNEE - COMPLETE 4+ VIEW

[knee ap]
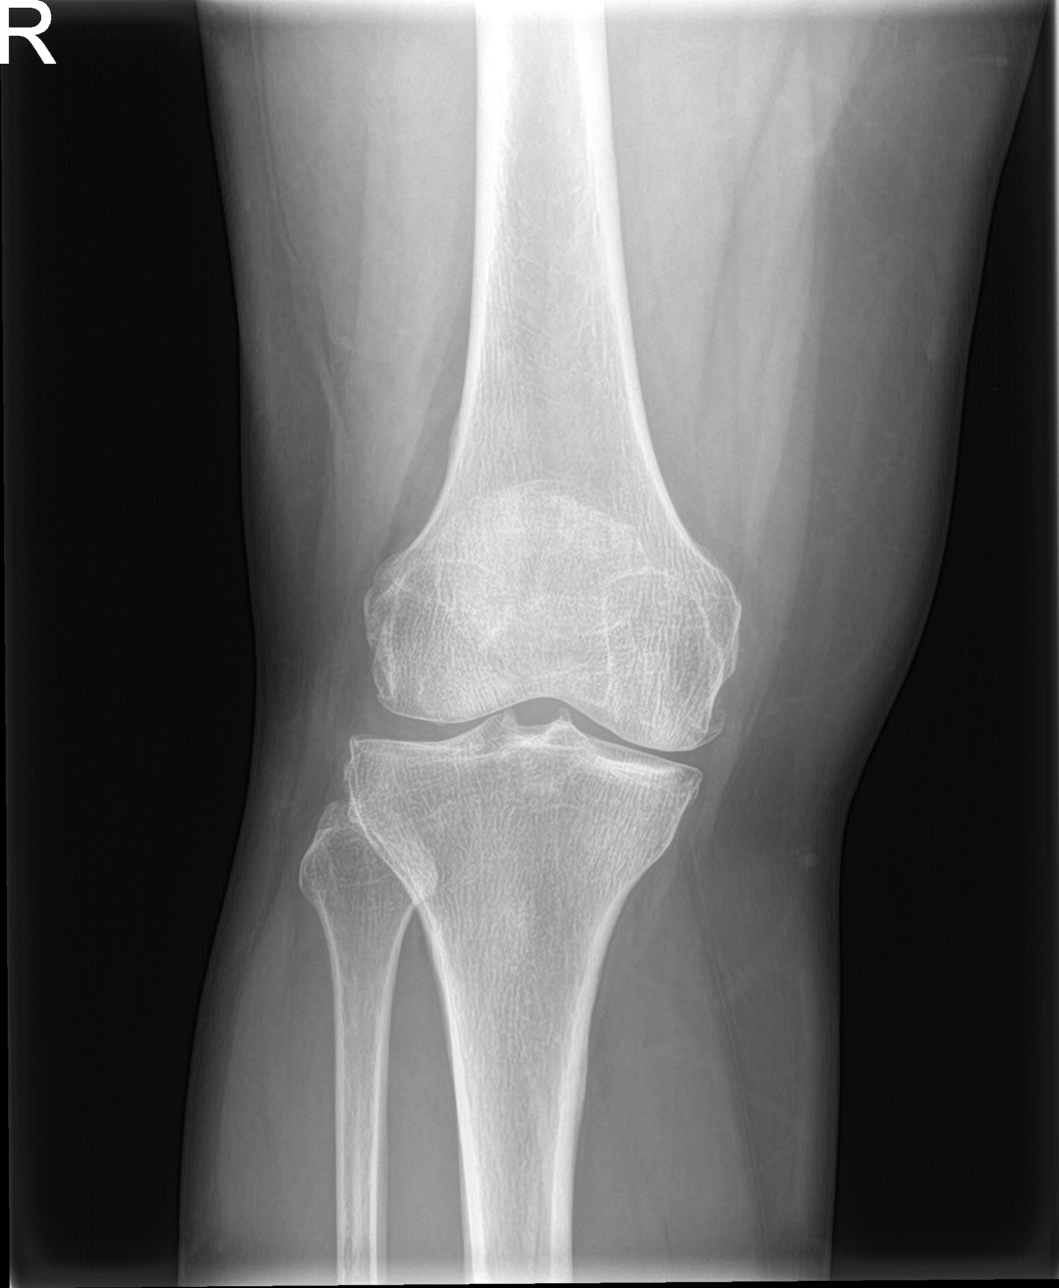

[knee lat]
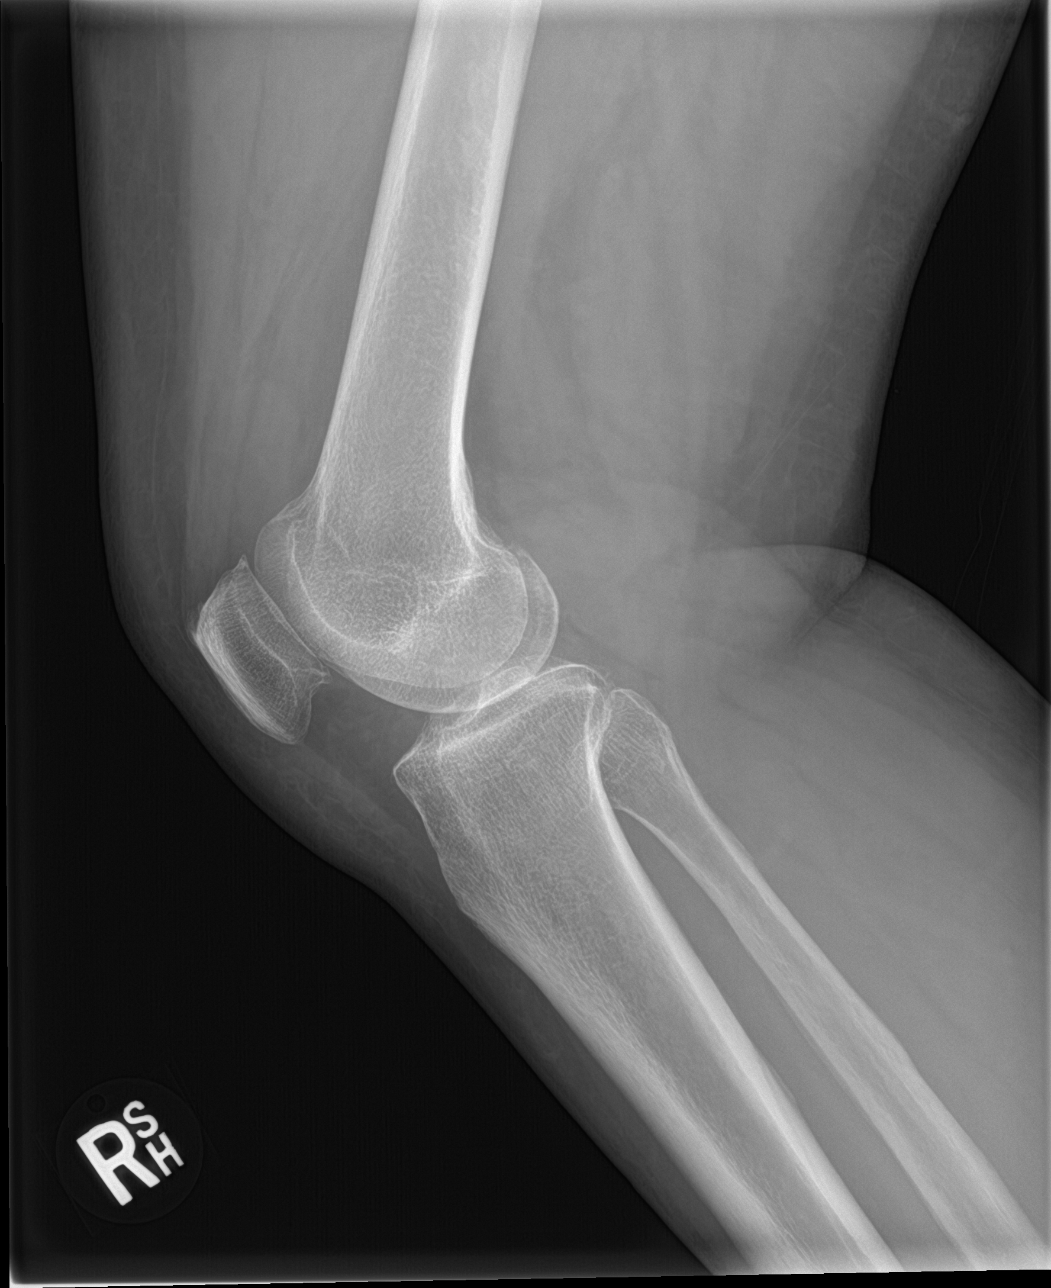

[knee obl (1 of 2)]
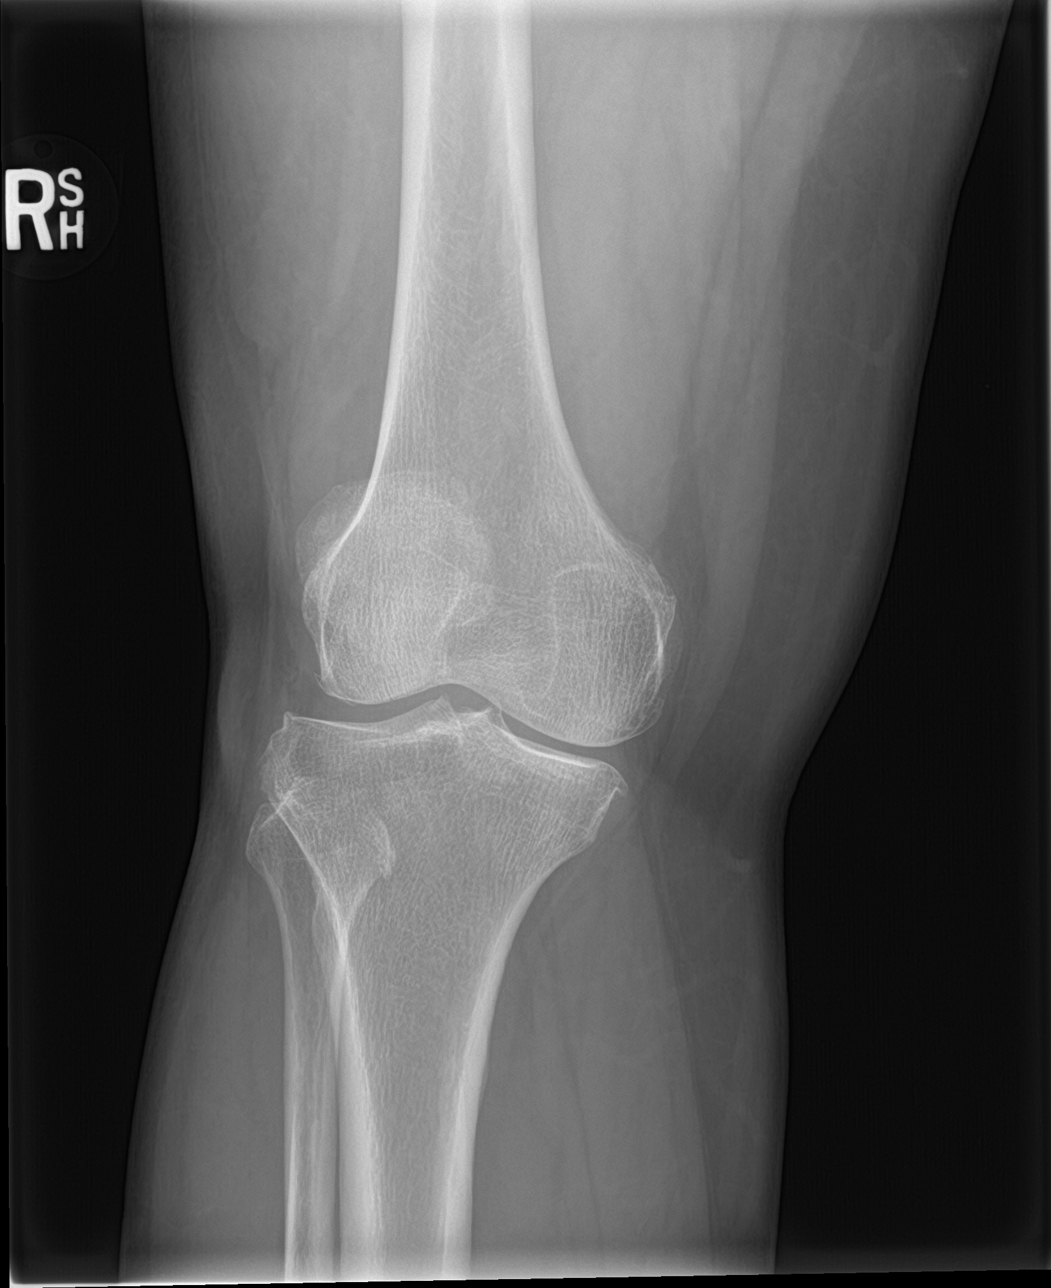

[knee obl (2 of 2)]
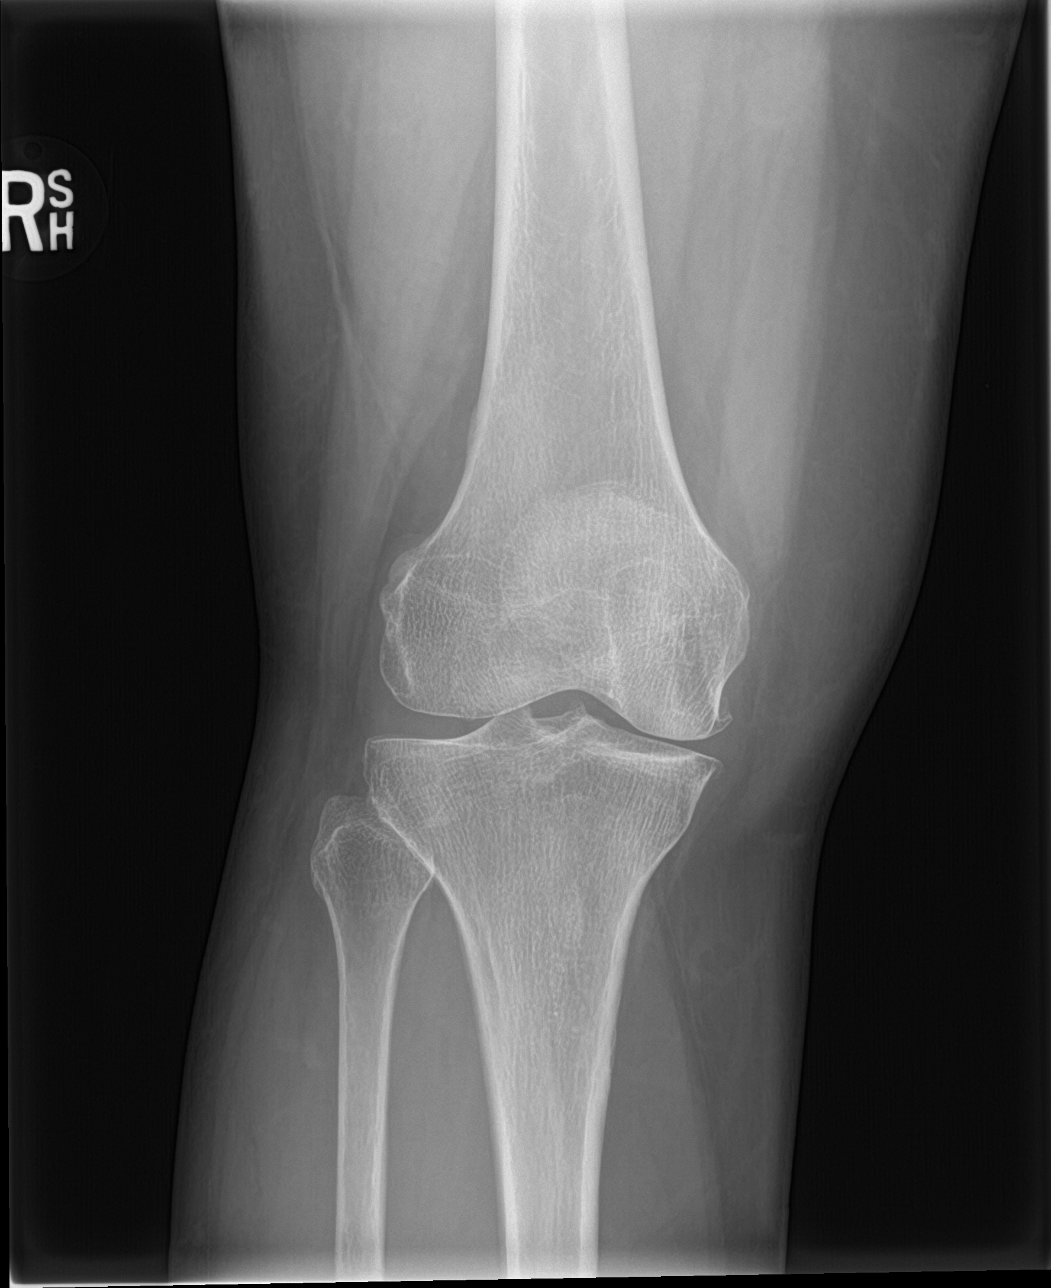

[4 of 4 positions shown; findings below may reference images not displayed]

FINDINGS: No acute displaced fracture. No focal soft tissue swelling. No joint
effusion. No radiopaque foreign body.

Medial greater than lateral joint space narrowing and marginal
osteophyte formation. Mild degenerative changes at the
patellofemoral joint. Knee joint is aligned.
IMPRESSION: Negative for acute bony abnormality.

Early tricompartmental osteoarthritis

## 2018-11-22 ENCOUNTER — Ambulatory Visit: Payer: BLUE CROSS/BLUE SHIELD | Admitting: Obstetrics & Gynecology

## 2018-11-23 ENCOUNTER — Encounter: Payer: Self-pay | Admitting: Family Medicine

## 2018-12-06 ENCOUNTER — Ambulatory Visit (INDEPENDENT_AMBULATORY_CARE_PROVIDER_SITE_OTHER): Payer: BLUE CROSS/BLUE SHIELD | Admitting: *Deleted

## 2018-12-06 DIAGNOSIS — Z23 Encounter for immunization: Secondary | ICD-10-CM | POA: Diagnosis not present

## 2018-12-06 NOTE — Progress Notes (Signed)
Patient came into office today for her 2nd Shingrix vaccine. First was given on 09/04/18.   Vaccine was given in the right deltoid. Pt tolerated injection well, given without incident or problem. Patient left without complaint.

## 2018-12-24 ENCOUNTER — Ambulatory Visit: Payer: BLUE CROSS/BLUE SHIELD

## 2019-01-07 ENCOUNTER — Ambulatory Visit
Admission: RE | Admit: 2019-01-07 | Discharge: 2019-01-07 | Disposition: A | Discharge status: Home / Self Care | Payer: BLUE CROSS/BLUE SHIELD | Source: Ambulatory Visit | Attending: Family Medicine | Admitting: Family Medicine

## 2019-01-07 DIAGNOSIS — Z1231 Encounter for screening mammogram for malignant neoplasm of breast: Secondary | ICD-10-CM | POA: Diagnosis not present

## 2019-01-07 DIAGNOSIS — M8589 Other specified disorders of bone density and structure, multiple sites: Secondary | ICD-10-CM | POA: Diagnosis not present

## 2019-01-07 DIAGNOSIS — E2839 Other primary ovarian failure: Secondary | ICD-10-CM

## 2019-01-07 DIAGNOSIS — Z780 Asymptomatic menopausal state: Secondary | ICD-10-CM | POA: Diagnosis not present

## 2019-01-07 DIAGNOSIS — Z1239 Encounter for other screening for malignant neoplasm of breast: Secondary | ICD-10-CM

## 2019-01-10 ENCOUNTER — Encounter: Payer: Self-pay | Admitting: Family Medicine

## 2019-01-10 DIAGNOSIS — M8580 Other specified disorders of bone density and structure, unspecified site: Secondary | ICD-10-CM | POA: Insufficient documentation

## 2019-01-11 ENCOUNTER — Encounter: Payer: Self-pay | Admitting: Family Medicine

## 2019-01-14 ENCOUNTER — Encounter: Payer: Self-pay | Admitting: Obstetrics & Gynecology

## 2019-01-14 ENCOUNTER — Other Ambulatory Visit: Payer: Self-pay

## 2019-01-14 ENCOUNTER — Ambulatory Visit: Payer: BLUE CROSS/BLUE SHIELD | Admitting: Obstetrics & Gynecology

## 2019-01-14 VITALS — BP 128/84 | Ht 60.0 in | Wt 151.0 lb

## 2019-01-14 DIAGNOSIS — Z01419 Encounter for gynecological examination (general) (routine) without abnormal findings: Secondary | ICD-10-CM

## 2019-01-14 DIAGNOSIS — E663 Overweight: Secondary | ICD-10-CM | POA: Diagnosis not present

## 2019-01-14 DIAGNOSIS — Z780 Asymptomatic menopausal state: Secondary | ICD-10-CM

## 2019-01-14 DIAGNOSIS — M8589 Other specified disorders of bone density and structure, multiple sites: Secondary | ICD-10-CM | POA: Diagnosis not present

## 2019-01-14 NOTE — Progress Notes (Signed)
Gloria [name] Mar 16, 1956 [id number]   History:    63 y.o. G0 Married.  Came back from the [place] 01/05/2019.  Asymptomatic.  RP:  New patient presenting for annual gyn exam   HPI:  Postmenopause, well on no HRT.  No PMB.  No pelvic pain.  Rarely sexually active, no pain with IC.  Urine/BMs normal.  BMI 29.49.  Mild physical activity.  Health labs with Fam MD.  Past medical history,surgical history, family history and social history were all reviewed and documented in the EPIC chart.  Gynecologic History No LMP recorded. Patient is postmenopausal. Contraception: post menopausal status Last Pap: Normal in the past Last mammogram: 01/07/2019. Results were: Negative Bone Density: 01/2019 Osteopenia -1.4 Colonoscopy: Will schedule now  Obstetric History OB History  Gravida Para Term Preterm AB Living  0 0 0 0 0 0  SAB TAB Ectopic Multiple Live Births  0 0 0 0 0     ROS: A ROS was performed and pertinent positives and negatives are included in the history.  GENERAL: No fevers or chills. HEENT: No change in vision, no earache, sore throat or sinus congestion. NECK: No pain or stiffness. CARDIOVASCULAR: No chest pain or pressure. No palpitations. PULMONARY: No shortness of breath, cough or wheeze. GASTROINTESTINAL: No abdominal pain, nausea, vomiting or diarrhea, melena or bright red blood per rectum. GENITOURINARY: No urinary frequency, urgency, hesitancy or dysuria. MUSCULOSKELETAL: No joint or muscle pain, no back pain, no recent trauma. DERMATOLOGIC: No rash, no itching, no lesions. ENDOCRINE: No polyuria, polydipsia, no heat or cold intolerance. No recent change in weight. HEMATOLOGICAL: No anemia or easy bruising or bleeding. NEUROLOGIC: No headache, seizures, numbness, tingling or weakness. PSYCHIATRIC: No depression, no loss of interest in normal activity or change in sleep pattern.     Exam:   BP 128/84   Ht 5' (1.524 m)   Wt 151 lb (68.5 kg)   BMI 29.49 kg/m   Body mass  index is 29.49 kg/m.  General appearance : Well developed well nourished female. No acute distress HE ENT: Eyes: no retinal hemorrhage or exudates,  Neck supple, trachea midline, no carotid bruits, no thyroidmegaly Lungs: Clear to auscultation, no rhonchi or wheezes, or rib retractions  Heart: Regular rate and rhythm, no murmurs or gallops Breast:Examined in sitting and supine position were symmetrical in appearance, no palpable masses or tenderness,  no skin retraction, no nipple inversion, no nipple discharge, no skin discoloration, no axillary or supraclavicular lymphadenopathy Abdomen: no palpable masses or tenderness, no rebound or guarding Extremities: no edema or skin discoloration or tenderness  Pelvic: Vulva: Normal             Vagina: No gross lesions or discharge  Cervix: No gross lesions or discharge.  Pap reflex done  Uterus  AV, normal size, shape and consistency, non-tender and mobile  Adnexa  Without masses or tenderness  Anus: Normal   Assessment/Plan:  63 y.o. female for annual exam   1. Encounter for routine gynecological examination with Papanicolaou smear of cervix Normal gynecologic exam in menopause.  Pap reflex done today.  Breast exam normal.  Screening mammogram January 07, 2019 was negative.  Will schedule colonoscopy now.  Health labs with family physician.  2. Postmenopausal Well on no hormone replacement therapy.  No postmenopausal bleeding.  3. Osteopenia of multiple sites Bone density January 07, 2019 showed osteopenia with a T score of -1.4.  Continue with vitamin D supplements, calcium intake of 1.5 g/day total and regular  weightbearing physical activities.  4. Overweight (BMI 25.0-29.9) Recommend a lower calorie/carb diet such as [employer].  Aerobic physical activities 5 times a week and weightlifting every 2 days.  Princess Bruins MD, 10:57 AM 01/14/2019

## 2019-01-14 NOTE — Patient Instructions (Signed)
1. Encounter for routine gynecological examination with Papanicolaou smear of cervix Normal gynecologic exam in menopause.  Pap reflex done today.  Breast exam normal.  Screening mammogram January 07, 2019 was negative.  Will schedule colonoscopy now.  Health labs with family physician.  2. Postmenopausal Well on no hormone replacement therapy.  No postmenopausal bleeding.  3. Osteopenia of multiple sites Bone density January 07, 2019 showed osteopenia with a T score of -1.4.  Continue with vitamin D supplements, calcium intake of 1.5 g/day total and regular weightbearing physical activities.  4. Overweight (BMI 25.0-29.9) Recommend a lower calorie/carb diet such as [employer].  Aerobic physical activities 5 times a week and weightlifting every 2 days.  Gloria [name], it was a pleasure seeing you today!  I will inform you of your results as soon as they are available.

## 2019-01-14 NOTE — Addendum Note (Signed)
Addended by: Thurnell Garbe A on: 01/14/2019 12:37 PM   Modules accepted: Orders

## 2019-01-17 LAB — PAP IG W/ RFLX HPV ASCU

## 2019-02-25 DIAGNOSIS — M542 Cervicalgia: Secondary | ICD-10-CM | POA: Diagnosis not present

## 2019-02-25 DIAGNOSIS — M9902 Segmental and somatic dysfunction of thoracic region: Secondary | ICD-10-CM | POA: Diagnosis not present

## 2019-02-25 DIAGNOSIS — M546 Pain in thoracic spine: Secondary | ICD-10-CM | POA: Diagnosis not present

## 2019-02-25 DIAGNOSIS — M9901 Segmental and somatic dysfunction of cervical region: Secondary | ICD-10-CM | POA: Diagnosis not present

## 2019-02-28 DIAGNOSIS — M9901 Segmental and somatic dysfunction of cervical region: Secondary | ICD-10-CM | POA: Diagnosis not present

## 2019-02-28 DIAGNOSIS — M546 Pain in thoracic spine: Secondary | ICD-10-CM | POA: Diagnosis not present

## 2019-02-28 DIAGNOSIS — M542 Cervicalgia: Secondary | ICD-10-CM | POA: Diagnosis not present

## 2019-02-28 DIAGNOSIS — M9902 Segmental and somatic dysfunction of thoracic region: Secondary | ICD-10-CM | POA: Diagnosis not present

## 2019-03-03 ENCOUNTER — Other Ambulatory Visit: Payer: Self-pay | Admitting: Family Medicine

## 2019-03-03 DIAGNOSIS — I10 Essential (primary) hypertension: Secondary | ICD-10-CM

## 2019-03-03 NOTE — Telephone Encounter (Signed)
Attempted to contact multiple times. Never rang on my end, a recording stated "all circuits are busy" and would not allow me to leave a message.

## 2019-03-03 NOTE — Telephone Encounter (Signed)
Pt returned call - she said she could wait to speak with office, her break was over. She is asking for someone to call husband.  His number is [phone]

## 2019-03-03 NOTE — Telephone Encounter (Signed)
Please inform patient the following information: I have received a refill request for her BP med. She has an appt end of May. If she will run out before her appt we can move her appt up and provide a virtual visit for her. Otherwise we will wait until May appt to refill

## 2019-03-03 NOTE — Telephone Encounter (Signed)
Husband is on DPR so I have let him know the information below.  He stated verbal understanding and said that he would have her call for a sooner appointment if she is going to run out of medication.

## 2019-03-11 ENCOUNTER — Encounter: Payer: Self-pay | Admitting: Family Medicine

## 2019-03-11 ENCOUNTER — Other Ambulatory Visit: Payer: Self-pay

## 2019-03-11 ENCOUNTER — Ambulatory Visit (INDEPENDENT_AMBULATORY_CARE_PROVIDER_SITE_OTHER): Payer: BLUE CROSS/BLUE SHIELD | Admitting: Family Medicine

## 2019-03-11 VITALS — BP 128/80 | HR 70 | Temp 98.3°F | Resp 14 | Ht 60.0 in | Wt 152.0 lb

## 2019-03-11 DIAGNOSIS — E663 Overweight: Secondary | ICD-10-CM | POA: Diagnosis not present

## 2019-03-11 DIAGNOSIS — R7303 Prediabetes: Secondary | ICD-10-CM | POA: Diagnosis not present

## 2019-03-11 DIAGNOSIS — E785 Hyperlipidemia, unspecified: Secondary | ICD-10-CM

## 2019-03-11 DIAGNOSIS — I10 Essential (primary) hypertension: Secondary | ICD-10-CM

## 2019-03-11 DIAGNOSIS — Z1211 Encounter for screening for malignant neoplasm of colon: Secondary | ICD-10-CM

## 2019-03-11 LAB — POCT GLYCOSYLATED HEMOGLOBIN (HGB A1C): Hemoglobin A1C: 5.9 % — AB (ref 4.0–5.6)

## 2019-03-11 MED ORDER — ATORVASTATIN CALCIUM 40 MG PO TABS: 40.0000 mg | ORAL_TABLET | Freq: Every day | ORAL | 3 refills | Status: DC

## 2019-03-11 MED ORDER — LISINOPRIL-HYDROCHLOROTHIAZIDE 10-12.5 MG PO TABS: 1.0000 | ORAL_TABLET | Freq: Every day | ORAL | 1 refills | Status: DC

## 2019-03-11 NOTE — Patient Instructions (Addendum)
physical due in 09/26/2019.  If you have your cholesterol and liver enzymes checked at work in [place] please make sure they forward me the results. If they do not do those both of those labs Please be seen here so we can check those for you.  Your blood pressure looks great. I have refilled your medications.     High Cholesterol  High cholesterol is a condition in which the blood has high levels of a white, waxy, fat-like substance (cholesterol). The human body needs small amounts of cholesterol. The liver makes all the cholesterol that the body needs. Extra (excess) cholesterol comes from the food that we eat. Cholesterol is carried from the liver by the blood through the blood vessels. If you have high cholesterol, deposits (plaques) may build up on the walls of your blood vessels (arteries). Plaques make the arteries narrower and stiffer. Cholesterol plaques increase your risk for heart attack and stroke. Work with your health care provider to keep your cholesterol levels in a healthy range. What increases the risk? This condition is more likely to develop in people who:  Eat foods that are high in animal fat (saturated fat) or cholesterol.  Are overweight.  Are not getting enough exercise.  Have a family history of high cholesterol. What are the signs or symptoms? There are no symptoms of this condition. How is this diagnosed? This condition may be diagnosed from the results of a blood test.  If you are older than age 78, your health care provider may check your cholesterol every 4-6 years.  You may be checked more often if you already have high cholesterol or other risk factors for heart disease. The blood test for cholesterol measures:  "Bad" cholesterol (LDL cholesterol). This is the main type of cholesterol that causes heart disease. The desired level for LDL is less than 100.  "Good" cholesterol (HDL cholesterol). This type helps to protect against heart disease by  cleaning the arteries and carrying the LDL away. The desired level for HDL is 60 or higher.  Triglycerides. These are fats that the body can store or burn for energy. The desired number for triglycerides is lower than 150.  Total cholesterol. This is a measure of the total amount of cholesterol in your blood, including LDL cholesterol, HDL cholesterol, and triglycerides. A healthy number is less than 200. How is this treated? This condition is treated with diet changes, lifestyle changes, and medicines. Diet changes  This may include eating more whole grains, fruits, vegetables, nuts, and fish.  This may also include cutting back on red meat and foods that have a lot of added sugar. Lifestyle changes  Changes may include getting at least 40 minutes of aerobic exercise 3 times a week. Aerobic exercises include walking, biking, and swimming. Aerobic exercise along with a healthy diet can help you maintain a healthy weight.  Changes may also include quitting smoking. Medicines  Medicines are usually given if diet and lifestyle changes have failed to reduce your cholesterol to healthy levels.  Your health care provider may prescribe a statin medicine. Statin medicines have been shown to reduce cholesterol, which can reduce the risk of heart disease. Follow these instructions at home: Eating and drinking If told by your health care provider:  Eat chicken (without skin), fish, veal, shellfish, ground [place] breast, and round or loin cuts of red meat.  Do not eat fried foods or fatty meats, such as hot dogs and salami.  Eat plenty of fruits, such as  apples.  Eat plenty of vegetables, such as broccoli, potatoes, and carrots.  Eat beans, peas, and lentils.  Eat grains such as barley, rice, couscous, and bulgur wheat.  Eat pasta without cream sauces.  Use skim or nonfat milk, and eat low-fat or nonfat yogurt and cheeses.  Do not eat or drink whole milk, cream, ice cream, egg yolks, or  hard cheeses.  Do not eat stick margarine or tub margarines that contain trans fats (also called partially hydrogenated oils).  Do not eat saturated tropical oils, such as coconut oil and palm oil.  Do not eat cakes, cookies, crackers, or other baked goods that contain trans fats.  General instructions  Exercise as directed by your health care provider. Increase your activity level with activities such as gardening, walking, and taking the stairs.  Take over-the-counter and prescription medicines only as told by your health care provider.  Do not use any products that contain nicotine or tobacco, such as cigarettes and e-cigarettes. If you need help quitting, ask your health care provider.  Keep all follow-up visits as told by your health care provider. This is important. Contact a health care provider if:  You are struggling to maintain a healthy diet or weight.  You need help to start on an exercise program.  You need help to stop smoking. Get help right away if:  You have chest pain.  You have trouble breathing. This information is not intended to replace advice given to you by your health care provider. Make sure you discuss any questions you have with your health care provider. Document Released: 10/20/2005 Document Revised: 05/17/2016 Document Reviewed: 04/19/2016 Elsevier Interactive Patient Education  [employer].

## 2019-03-11 NOTE — Progress Notes (Signed)
Patient Care Team    Relationship Specialty Notifications Start End  Ma Hillock, DO PCP - General Family Medicine  08/20/15   Princess Bruins, MD Consulting Physician Obstetrics and Gynecology  09/24/18      SUBJECTIVE Chief Complaint  Patient presents with  . Hypertension    HPI:  Hypertension/HLD/obesity:  Pt reports compliance with lisinopril -HCTZ 10-12.5 mg daily. Blood pressures ranges at home normal. Patient denies chest pain, shortness of breath or lower extremity edema. Pt takes a daily baby ASA. Pt is  prescribed statin>>> but never started it. BMP: 09/24/2018 within normal limits. CBC: 09/24/2018 within normal limits Lipid: 09/24/2018 total cholesterol 283, HDL 47, LDL 191, triglycerides 230.>>  Discussed starting statin after these results and prescribed, patient never started the medication. TSH: 05/24/2018 1.20 Diet: Tries to follow a low-salt, low saturated, low sugar diet. Exercise: Does not exercise routinely RF: Hypertension, hyperlipidemia, obesity, history of heart disease in her father.  Diabetes: A1c 6.3 09/24/2018.  She has been trying to modify her diet, eating high sugar content meals.    ROS: See pertinent positives and negatives per HPI.  Patient Active Problem List   Diagnosis Date Noted  . Osteopenia 01/10/2019  . Hyperlipidemia LDL goal <130 09/27/2018  . Gout 03/16/2018  . Primary osteoarthritis of right knee 08/31/2017  . Injury of right knee 04/24/2017  . Prediabetes 06/23/2016  . Nipple dermatitis 01/18/2016  . Obesity (BMI 30-39.9) 05/03/2015  . Vitamin D deficiency 05/03/2015  . Essential hypertension 05/03/2015  . Preventative health care 05/01/2015    Social History   Tobacco Use  . Smoking status: Never Smoker  . Smokeless tobacco: Never Used  Substance Use Topics  . Alcohol use: No    Alcohol/week: 0.0 standard drinks    Current Outpatient Medications:  .  lisinopril-hydrochlorothiazide (PRINZIDE,ZESTORETIC)  10-12.5 MG tablet, Take 1 tablet by mouth daily. Office visit needed for additional refills, Disp: 90 tablet, Rfl: 1 .  atorvastatin (LIPITOR) 40 MG tablet, Take 40 mg by mouth daily., Disp: , Rfl:   No Known Allergies  OBJECTIVE: BP 128/80   Pulse 70   Temp 98.3 F (36.8 C) (Oral)   Resp 14   Ht 5' (1.524 m)   Wt 152 lb (68.9 kg)   SpO2 97%   BMI 29.69 kg/m  Gen: Afebrile. No acute distress.  Nontoxic in presentation.  Pleasant.  Overweight female. HENT: AT. [place].  MMM.  Eyes:Pupils Equal Round Reactive to light, Extraocular movements intact,  Conjunctiva without redness, discharge or icterus. Neck/lymp/endocrine: Supple, no lymphadenopathy, no thyromegaly CV: RRR no murmur, no edema, +2/4 P posterior tibialis pulses Chest: CTAB, no wheeze or crackles Abd: Soft.. NTND. BS present.  Neuro:  Normal gait. PERLA. EOMi. Alert. Oriented. Psych: Normal affect, dress and demeanor. Normal speech. Normal thought content and judgment..   Results for orders placed or performed in visit on 03/11/19 (from the past 48 hour(s))  POCT HgB A1C     Status: Abnormal   Collection Time: 03/11/19 11:34 AM  Result Value Ref Range   Hemoglobin A1C 5.9 (A) 4.0 - 5.6 %   HbA1c POC (<> result, manual entry)     HbA1c, POC (prediabetic range)     HbA1c, POC (controlled diabetic range)      ASSESSMENT AND PLAN: Bo Rogue is a 63 y.o. female present for  Essential hypertension/hyperlipidemia/overweight -Stable.  Continue lisinopril-HCTZ 10-12.5 mg daily, refills provided today. -Strongly encouraged her to start Lipitor.  Lengthy time spent today  educating her on cardiovascular risk with her cholesterol levels.  She is agreeable now to start the Lipitor.  Called in again for her. Her CV risk score is 15.6% If her employer collects labs in 3 months for her cholesterol and her CMP I have asked her to have them forwarded to [place].  If they do not collect those labs as she states they will, she will follow-up  here in 3 months.  If they do collect labs she will follow-up here in 6 months for her CPE.  Prediabetes A1c is 5.9 today.  Congratulated her on the good work bringing it down from 6.36 months ago. -Continue the dietary modifications and increase her exercise. - POCT HgB A1C  Colon cancers screening: Referred to GI again. She had been unable to schedule from last referral. She wants to schedule for summertime now.   F/U:  If her employer collects labs in 3 months for her cholesterol and her CMP I have asked her to have them forwarded to [place].  If they do not collect those labs as she states they will, she will follow-up here in 3 months.  If they do collect labs she will follow-up here in 6 months for her CPE.  > 25 minutes spent with patient, >50% of time spent face to face counseling and coordinating care.    Howard Pouch, DO 03/11/2019

## 2019-03-25 ENCOUNTER — Ambulatory Visit: Payer: BLUE CROSS/BLUE SHIELD | Admitting: Family Medicine

## 2019-08-25 ENCOUNTER — Telehealth: Payer: Self-pay | Admitting: Family Medicine

## 2019-08-25 DIAGNOSIS — I10 Essential (primary) hypertension: Secondary | ICD-10-CM

## 2019-08-25 MED ORDER — LISINOPRIL-HYDROCHLOROTHIAZIDE 10-12.5 MG PO TABS: 1.0000 | ORAL_TABLET | Freq: Every day | ORAL | 0 refills | Status: DC

## 2019-08-25 NOTE — Telephone Encounter (Signed)
30 tablets sent to patients pharmacy to last until CPE appt.

## 2019-08-25 NOTE — Telephone Encounter (Signed)
Patient request refill. States that she has about 10 or 15 more pills left in case its gets denied for too early for fill Patient scheduled CPE for 09/26/19.   lisinopril-hydrochlorothiazide (ZESTORETIC) 10-12.5 MG tablet [id number]  [facility]

## 2019-08-26 DIAGNOSIS — H9012 Conductive hearing loss, unilateral, left ear, with unrestricted hearing on the contralateral side: Secondary | ICD-10-CM | POA: Diagnosis not present

## 2019-08-26 DIAGNOSIS — H7292 Unspecified perforation of tympanic membrane, left ear: Secondary | ICD-10-CM | POA: Diagnosis not present

## 2019-09-26 ENCOUNTER — Encounter: Payer: Self-pay | Admitting: Family Medicine

## 2019-09-26 ENCOUNTER — Other Ambulatory Visit: Payer: Self-pay

## 2019-09-26 ENCOUNTER — Telehealth: Payer: Self-pay | Admitting: Family Medicine

## 2019-09-26 ENCOUNTER — Ambulatory Visit (INDEPENDENT_AMBULATORY_CARE_PROVIDER_SITE_OTHER): Payer: BC Managed Care – PPO | Admitting: Family Medicine

## 2019-09-26 VITALS — BP 142/84 | HR 66 | Temp 97.4°F | Resp 16 | Ht 60.0 in | Wt 152.5 lb

## 2019-09-26 DIAGNOSIS — E785 Hyperlipidemia, unspecified: Secondary | ICD-10-CM | POA: Diagnosis not present

## 2019-09-26 DIAGNOSIS — I10 Essential (primary) hypertension: Secondary | ICD-10-CM

## 2019-09-26 DIAGNOSIS — Z1211 Encounter for screening for malignant neoplasm of colon: Secondary | ICD-10-CM

## 2019-09-26 DIAGNOSIS — E559 Vitamin D deficiency, unspecified: Secondary | ICD-10-CM | POA: Diagnosis not present

## 2019-09-26 DIAGNOSIS — H7292 Unspecified perforation of tympanic membrane, left ear: Secondary | ICD-10-CM | POA: Insufficient documentation

## 2019-09-26 DIAGNOSIS — E669 Obesity, unspecified: Secondary | ICD-10-CM | POA: Insufficient documentation

## 2019-09-26 DIAGNOSIS — Z0000 Encounter for general adult medical examination without abnormal findings: Secondary | ICD-10-CM

## 2019-09-26 DIAGNOSIS — E663 Overweight: Secondary | ICD-10-CM | POA: Diagnosis not present

## 2019-09-26 DIAGNOSIS — R7303 Prediabetes: Secondary | ICD-10-CM | POA: Diagnosis not present

## 2019-09-26 DIAGNOSIS — M8589 Other specified disorders of bone density and structure, multiple sites: Secondary | ICD-10-CM

## 2019-09-26 LAB — COMPREHENSIVE METABOLIC PANEL
ALT: 15 U/L (ref 0–35)
AST: 16 U/L (ref 0–37)
Albumin: 4.1 g/dL (ref 3.5–5.2)
Alkaline Phosphatase: 61 U/L (ref 39–117)
BUN: 14 mg/dL (ref 6–23)
CO2: 27 mEq/L (ref 19–32)
Calcium: 9.3 mg/dL (ref 8.4–10.5)
Chloride: 97 mEq/L (ref 96–112)
Creatinine, Ser: 0.57 mg/dL (ref 0.40–1.20)
GFR: 106.68 mL/min (ref >60.00)
Glucose, Bld: 126 mg/dL — ABNORMAL HIGH (ref 70–99)
Potassium: 4.5 mEq/L (ref 3.5–5.1)
Sodium: 132 mEq/L — ABNORMAL LOW (ref 135–145)
Total Bilirubin: 0.5 mg/dL (ref 0.2–1.2)
Total Protein: 7 g/dL (ref 6.0–8.3)

## 2019-09-26 LAB — LIPID PANEL
Cholesterol: 161 mg/dL (ref 0–200)
HDL: 48.9 mg/dL (ref >39.00)
NonHDL: 112.28
Total CHOL/HDL Ratio: 3
Triglycerides: 255 mg/dL — ABNORMAL HIGH (ref 0.0–149.0)
VLDL: 51 mg/dL — ABNORMAL HIGH (ref 0.0–40.0)

## 2019-09-26 LAB — TSH: TSH: 0.99 u[IU]/mL (ref 0.35–4.50)

## 2019-09-26 LAB — CBC
HCT: 36 % (ref 36.0–46.0)
Hemoglobin: 12.1 g/dL (ref 12.0–15.0)
MCHC: 33.5 g/dL (ref 30.0–36.0)
MCV: 88.9 fl (ref 78.0–100.0)
Platelets: 237 10*3/uL (ref 150.0–400.0)
RBC: 4.05 Mil/uL (ref 3.87–5.11)
RDW: 12.5 % (ref 11.5–15.5)
WBC: 7.2 10*3/uL (ref 4.0–10.5)

## 2019-09-26 LAB — HEMOGLOBIN A1C: Hgb A1c MFr Bld: 6.1 % (ref 4.6–6.5)

## 2019-09-26 LAB — LDL CHOLESTEROL, DIRECT: Direct LDL: 87 mg/dL

## 2019-09-26 LAB — VITAMIN D 25 HYDROXY (VIT D DEFICIENCY, FRACTURES): VITD: 33.04 ng/mL (ref 30.00–100.00)

## 2019-09-26 MED ORDER — LISINOPRIL-HYDROCHLOROTHIAZIDE 10-12.5 MG PO TABS: 1.0000 | ORAL_TABLET | Freq: Every day | ORAL | 1 refills | Status: DC

## 2019-09-26 MED ORDER — ATORVASTATIN CALCIUM 40 MG PO TABS: 40.0000 mg | ORAL_TABLET | Freq: Every day | ORAL | 3 refills | Status: DC

## 2019-09-26 NOTE — Telephone Encounter (Signed)
Please inform patient the following information: She has very mildly low sodium, this can be secondary to her blood pressure medication.  Would encourage her to make sure she is not only drinking water but drinking something with electrolytes, such as low sugar Gatorade.  Her liver and kidney function are all normal.   Her cholesterol looks great.  Glycerides appear elevated, but she was not fasting so this is as expected. Her blood counts look good. Her diabetes/A1c screening did increase from 5.9 to 6.1.  Increase exercise, lower sugar diet. Her thyroid is functioning normally.  Follow-up in 5.5 months.

## 2019-09-26 NOTE — Patient Instructions (Signed)
Health Maintenance, Female Adopting a healthy lifestyle and getting preventive care are important in promoting health and wellness. Ask your health care provider about:  The right schedule for you to have regular tests and exams.  Things you can do on your own to prevent diseases and keep yourself healthy. What should I know about diet, weight, and exercise? Eat a healthy diet   Eat a diet that includes plenty of vegetables, fruits, low-fat dairy products, and lean protein.  Do not eat a lot of foods that are high in solid fats, added sugars, or sodium. Maintain a healthy weight Body mass index (BMI) is used to identify weight problems. It estimates body fat based on height and weight. Your health care provider can help determine your BMI and help you achieve or maintain a healthy weight. Get regular exercise Get regular exercise. This is one of the most important things you can do for your health. Most adults should:  Exercise for at least 150 minutes each week. The exercise should increase your heart rate and make you sweat (moderate-intensity exercise).  Do strengthening exercises at least twice a week. This is in addition to the moderate-intensity exercise.  Spend less time sitting. Even light physical activity can be beneficial. Watch cholesterol and blood lipids Have your blood tested for lipids and cholesterol at 64 years of age, then have this test every 5 years. Have your cholesterol levels checked more often if:  Your lipid or cholesterol levels are high.  You are older than 64 years of age.  You are at high risk for heart disease. What should I know about cancer screening? Depending on your health history and family history, you may need to have cancer screening at various ages. This may include screening for:  Breast cancer.  Cervical cancer.  Colorectal cancer.  Skin cancer.  Lung cancer. What should I know about heart disease, diabetes, and high blood  pressure? Blood pressure and heart disease  High blood pressure causes heart disease and increases the risk of stroke. This is more likely to develop in people who have high blood pressure readings, are of African descent, or are overweight.  Have your blood pressure checked: ? Every 3-5 years if you are 18-39 years of age. ? Every year if you are 40 years old or older. Diabetes Have regular diabetes screenings. This checks your fasting blood sugar level. Have the screening done:  Once every three years after age 40 if you are at a normal weight and have a low risk for diabetes.  More often and at a younger age if you are overweight or have a high risk for diabetes. What should I know about preventing infection? Hepatitis B If you have a higher risk for hepatitis B, you should be screened for this virus. Talk with your health care provider to find out if you are at risk for hepatitis B infection. Hepatitis C Testing is recommended for:  Everyone born from 1945 through 1965.  Anyone with known risk factors for hepatitis C. Sexually transmitted infections (STIs)  Get screened for STIs, including gonorrhea and chlamydia, if: ? You are sexually active and are younger than 64 years of age. ? You are older than 64 years of age and your health care provider tells you that you are at risk for this type of infection. ? Your sexual activity has changed since you were last screened, and you are at increased risk for chlamydia or gonorrhea. Ask your health care provider if   you are at risk.  Ask your health care provider about whether you are at high risk for HIV. Your health care provider may recommend a prescription medicine to help prevent HIV infection. If you choose to take medicine to prevent HIV, you should first get tested for HIV. You should then be tested every 3 months for as long as you are taking the medicine. Pregnancy  If you are about to stop having your period (premenopausal) and  you may become pregnant, seek counseling before you get pregnant.  Take 400 to 800 micrograms (mcg) of folic acid every day if you become pregnant.  Ask for birth control (contraception) if you want to prevent pregnancy. Osteoporosis and menopause Osteoporosis is a disease in which the bones lose minerals and strength with aging. This can result in bone fractures. If you are 65 years old or older, or if you are at risk for osteoporosis and fractures, ask your health care provider if you should:  Be screened for bone loss.  Take a calcium or vitamin D supplement to lower your risk of fractures.  Be given hormone replacement therapy (HRT) to treat symptoms of menopause. Follow these instructions at home: Lifestyle  Do not use any products that contain nicotine or tobacco, such as cigarettes, e-cigarettes, and chewing tobacco. If you need help quitting, ask your health care provider.  Do not use street drugs.  Do not share needles.  Ask your health care provider for help if you need support or information about quitting drugs. Alcohol use  Do not drink alcohol if: ? Your health care provider tells you not to drink. ? You are pregnant, may be pregnant, or are planning to become pregnant.  If you drink alcohol: ? Limit how much you use to 0-1 drink a day. ? Limit intake if you are breastfeeding.  Be aware of how much alcohol is in your drink. In the [place] one drink equals one 12 oz bottle of beer (355 mL), one 5 oz glass of wine (148 mL), or one 1 oz glass of hard liquor (44 mL). General instructions  Schedule regular health, dental, and eye exams.  Stay current with your vaccines.  Tell your health care provider if: ? You often feel depressed. ? You have ever been abused or do not feel safe at home. Summary  Adopting a healthy lifestyle and getting preventive care are important in promoting health and wellness.  Follow your health care provider's instructions about healthy  diet, exercising, and getting tested or screened for diseases.  Follow your health care provider's instructions on monitoring your cholesterol and blood pressure. This information is not intended to replace advice given to you by your health care provider. Make sure you discuss any questions you have with your health care provider. Document Released: 05/05/2011 Document Revised: 10/13/2018 Document Reviewed: 10/13/2018 Elsevier Patient Education  2020 [employer]  

## 2019-09-26 NOTE — Progress Notes (Signed)
Patient ID: Gloria [name], female  DOB: 1956/08/17, 63 y.o.   MRN: [id number] Patient Care Team    Relationship Specialty Notifications Start End  Ma Hillock, DO PCP - General Family Medicine  08/20/15   Princess Bruins, MD Consulting Physician Obstetrics and Gynecology  09/24/18     Chief Complaint  Patient presents with  . Annual Exam    01/2019 mammogram, Pap smear 01/2019. Not fasting. Took BP medications at night. Pt takes BP every morning, this AM 132/74     Subjective:  Gloria [name] is a 63 y.o.  Female  present for CPE. All past medical history, surgical history, allergies, family history, immunizations, medications and social history were updated in the electronic medical record today. All recent labs, ED visits and hospitalizations within the last year were reviewed.  Hypertension/HLD/obesity:  Pt reports compliance with lisinopril -HCTZ 10-12.5 mg daily. Blood pressures ranges at home normal. Patient denies chest pain, shortness of breath or lower extremity edema. Pt takes a daily baby ASA. Pt is  prescribed statin>>> but never started it. BMP: 09/24/2018 within normal limits. CBC: 09/24/2018 within normal limits Lipid: 09/24/2018 total cholesterol 283, HDL 47, LDL 191, triglycerides 230.>>  Discussed starting statin after these results and prescribed, patient never started the medication. TSH: 05/24/2018 1.20 Diet: Tries to follow a low-salt, low saturated, low sugar diet. Exercise: Does not exercise routinely RF: Hypertension, hyperlipidemia, obesity, history of heart disease in her father.  Diabetes: A1c 5.9 03/2019.  She has been trying to modify her diet, eating high sugar content meals.   Health maintenance:  Colonoscopy: never, no fhx. Referred twice. Still has not had call/schedule. She also states she may have done a cologuard in feb 2020? Not ordered by this provider> she will get records.  Mammogram: no fhx, completed: 01/2019, Normal --  [facility]  Cervical cancer screening: last pap: 01/2019. Immunizations: tdap updated today 09/2018, Influenza UTD 08/2019 (encouraged yearly),  Shingrix series completed 2020 Infectious disease screening: HIV and  Hep C completed DEXA: 01/07/2019, rpt 5 yr Assistive device: none Oxygen [id number] Patient has a Dental home. Hospitalizations/ED visits: reviewed    Depression screen [facility] 2/9 09/26/2019 03/11/2019 09/24/2018 07/23/2018 02/12/2018  Decreased Interest 0 0 0 0 0  Down, Depressed, Hopeless 0 0 0 0 0  PHQ - 2 Score 0 0 0 0 0   No flowsheet data found.   Immunization History  Administered Date(s) Administered  . Influenza-Unspecified 10/03/2014, 09/04/2015, 08/17/2018  . Td 11/04/2007  . Tdap 09/24/2018  . Zoster Recombinat (Shingrix) 09/24/2018, 12/06/2018     Past Medical History:  Diagnosis Date  . Allergy   . High cholesterol   . Hypertension   . Nipple dermatitis 01/18/2016  . Seasonal allergies    No Known Allergies Past Surgical History:  Procedure Laterality Date  . NO PAST SURGERIES     Family History  Problem Relation Age of Onset  . Heart disease Father   . Alcohol abuse Brother   . Kidney disease Brother    Social History   Social History Narrative   Gloria [name] is married.  She lives with husband & adopted daughter. She works FT at [employer].   Smoke detector in the home, wears her seatbelt.     Allergies as of 09/26/2019   No Known Allergies     Medication List       Accurate as of September 26, 2019  1:31 PM. If you have any questions,  ask your nurse or doctor.        atorvastatin 40 MG tablet Commonly known as: LIPITOR Take 1 tablet (40 mg total) by mouth daily.   cholecalciferol 25 MCG (1000 UT) tablet Commonly known as: VITAMIN D3 Take 1,000 Units by mouth daily.   lisinopril-hydrochlorothiazide 10-12.5 MG tablet Commonly known as: ZESTORETIC Take 1 tablet by mouth daily. Office visit needed for additional refills        All past medical history, surgical history, allergies, family history, immunizations andmedications were updated in the EMR today and reviewed under the history and medication portions of their EMR.     ROS: 14 pt review of systems performed and negative (unless mentioned in an HPI)  Objective: BP (!) 142/84 (BP Location: Right Arm, Patient Position: Sitting, Cuff Size: Normal)   Pulse 66   Temp (!) 97.4 F (36.3 C) (Temporal)   Resp 16   Ht 5' (1.524 m)   Wt 152 lb 8 oz (69.2 kg)   SpO2 98%   BMI 29.78 kg/m  Gen: Afebrile. No acute distress. Nontoxic in appearance, well-developed, well-nourished,  Overweight, pleasant female.  HENT: AT. [place]. Bilateral TM visualized normal right. Left with perforation. normal external auditory canal. MMM, no oral lesions, adequate dentition. Bilateral nares within normal limits. Throat without erythema, ulcerations or exudates. no Cough on exam, no hoarseness on exam. Eyes:Pupils Equal Round Reactive to light, Extraocular movements intact,  Conjunctiva without redness, discharge or icterus. Neck/lymp/endocrine: Supple,no lymphadenopathy, no thyromegaly CV: RRR no murmur, no edema, +2/4 P posterior tibialis pulses. no carotid bruits. No JVD. Chest: CTAB, no wheeze, rhonchi or crackles. normal Respiratory effort. good Air movement. Abd: Soft. obese. NTND. BS present. no Masses palpated. No hepatosplenomegaly. No rebound tenderness or guarding. Skin: no rashes, purpura or petechiae. Warm and well-perfused. Skin intact. Neuro/Msk:  Normal gait. PERLA. EOMi. Alert. Oriented x3.  Cranial nerves II through XII intact. Muscle strength 5/5 upper/lower extremity. DTRs equal bilaterally. Psych: Normal affect, dress and demeanor. Normal speech. Normal thought content and judgment.  No exam data present  Assessment/plan: Demarie Atz is a 63 y.o. female present for CPE Essential hypertension/hyperlipidemia/overweight -Stable.   Continue lisinopril-HCTZ 10-12.5 mg  daily, refills provided today - continue lipitor.   - low sodium, exercise.  - cbc, cmp, tsh, lipid - f/u 6 mos Prediabetes A1c is 5.9 today.   -Continue the dietary modifications and increase her exercise. a1c ordered today Vitamin D deficiency - Vitamin D (25 hydroxy) Osteopenia of multiple sites Rpt in 5 yrs.  Colon cancer screening - Ambulatory referral to Gastroenterology Perforated ear drum, left Has ENT following. They suggested surgery.  Encounter for preventive health examination Patient was encouraged to exercise greater than 150 minutes a week. Patient was encouraged to choose a diet filled with fresh fruits and vegetables, and lean meats. AVS provided to patient today for education/recommendation on gender specific health and safety maintenance. Colonoscopy: never, no fhx. Referred twice. Still has not had call/schedule. She also states she may have done a cologuard in feb 2020? Not ordered by this provider> she will get records.  Mammogram: no fhx, completed: 01/2019, Normal --  Breast center Cervical cancer screening: last pap: 01/2019. Immunizations: tdap updated today 09/2018, Influenza UTD 08/2019 (encouraged yearly),  Shingrix series completed 2020 Infectious disease screening: HIV and  Hep C completed DEXA: 01/07/2019, rpt 5 yr  Follow up 6 mos [facility]/ 1 year for CPE Return in about 1 year (around 09/25/2020) for CPE (30 min).  Electronically  signed by: Howard Pouch, DO [place]

## 2019-09-27 NOTE — Telephone Encounter (Signed)
Pts primary number was called and it would not connect or ring. Pts husband was called and he said she cuts phone off while at work and to try again later today, after 3pm

## 2019-09-27 NOTE — Telephone Encounter (Signed)
Pt was called and given information, she verbalized understanding  

## 2019-11-13 DIAGNOSIS — S0502XA Injury of conjunctiva and corneal abrasion without foreign body, left eye, initial encounter: Secondary | ICD-10-CM | POA: Diagnosis not present

## 2019-11-14 DIAGNOSIS — T2612XA Burn of cornea and conjunctival sac, left eye, initial encounter: Secondary | ICD-10-CM | POA: Diagnosis not present

## 2019-11-23 ENCOUNTER — Other Ambulatory Visit: Payer: Self-pay

## 2019-11-23 DIAGNOSIS — I10 Essential (primary) hypertension: Secondary | ICD-10-CM

## 2019-11-23 MED ORDER — LISINOPRIL-HYDROCHLOROTHIAZIDE 10-12.5 MG PO TABS: 1.0000 | ORAL_TABLET | Freq: Every day | ORAL | 3 refills | Status: DC

## 2020-01-13 DIAGNOSIS — H7292 Unspecified perforation of tympanic membrane, left ear: Secondary | ICD-10-CM | POA: Diagnosis not present

## 2020-01-13 DIAGNOSIS — H9012 Conductive hearing loss, unilateral, left ear, with unrestricted hearing on the contralateral side: Secondary | ICD-10-CM | POA: Diagnosis not present

## 2020-02-23 ENCOUNTER — Other Ambulatory Visit: Payer: Self-pay

## 2020-02-23 DIAGNOSIS — I10 Essential (primary) hypertension: Secondary | ICD-10-CM

## 2020-02-23 MED ORDER — LISINOPRIL-HYDROCHLOROTHIAZIDE 10-12.5 MG PO TABS: 1.0000 | ORAL_TABLET | Freq: Every day | ORAL | 0 refills | Status: DC

## 2020-03-02 DIAGNOSIS — M25561 Pain in right knee: Secondary | ICD-10-CM | POA: Diagnosis not present

## 2020-03-02 DIAGNOSIS — M25512 Pain in left shoulder: Secondary | ICD-10-CM | POA: Diagnosis not present

## 2020-03-02 DIAGNOSIS — M13861 Other specified arthritis, right knee: Secondary | ICD-10-CM | POA: Diagnosis not present

## 2020-03-02 DIAGNOSIS — M25562 Pain in left knee: Secondary | ICD-10-CM | POA: Diagnosis not present

## 2020-03-06 ENCOUNTER — Other Ambulatory Visit: Payer: Self-pay | Admitting: Family Medicine

## 2020-03-06 DIAGNOSIS — Z1231 Encounter for screening mammogram for malignant neoplasm of breast: Secondary | ICD-10-CM

## 2020-03-09 ENCOUNTER — Other Ambulatory Visit: Payer: Self-pay

## 2020-03-09 ENCOUNTER — Ambulatory Visit
Admission: RE | Admit: 2020-03-09 | Discharge: 2020-03-09 | Disposition: A | Discharge status: Home / Self Care | Payer: BC Managed Care – PPO | Source: Ambulatory Visit | Attending: Family Medicine | Admitting: Family Medicine

## 2020-03-09 DIAGNOSIS — Z1231 Encounter for screening mammogram for malignant neoplasm of breast: Secondary | ICD-10-CM | POA: Diagnosis not present

## 2020-03-12 DIAGNOSIS — M25512 Pain in left shoulder: Secondary | ICD-10-CM | POA: Diagnosis not present

## 2020-03-14 DIAGNOSIS — M25512 Pain in left shoulder: Secondary | ICD-10-CM | POA: Diagnosis not present

## 2020-03-19 DIAGNOSIS — M25512 Pain in left shoulder: Secondary | ICD-10-CM | POA: Diagnosis not present

## 2020-03-21 DIAGNOSIS — M25512 Pain in left shoulder: Secondary | ICD-10-CM | POA: Diagnosis not present

## 2020-03-23 ENCOUNTER — Telehealth: Payer: Self-pay

## 2020-03-23 DIAGNOSIS — I10 Essential (primary) hypertension: Secondary | ICD-10-CM

## 2020-03-23 MED ORDER — LISINOPRIL-HYDROCHLOROTHIAZIDE 10-12.5 MG PO TABS: 1.0000 | ORAL_TABLET | Freq: Every day | ORAL | 0 refills | Status: DC

## 2020-03-23 NOTE — Telephone Encounter (Signed)
Patient refill request  lisinopril-hydrochlorothiazide (ZESTORETIC) 10-12.5 MG tablet [id number]   [facility]

## 2020-03-23 NOTE — Telephone Encounter (Signed)
Pt was called and scheduled appt for [facility]. Pt is out of medication and short supply was sent to the pharmacy.

## 2020-03-23 NOTE — Addendum Note (Signed)
Addended by: Caroll Rancher L on: 03/23/2020 09:49 AM   Modules accepted: Orders

## 2020-03-23 NOTE — Telephone Encounter (Signed)
Tried to call pt, no answer and unable to leave VM, my chart message sent. Pt due for [place].

## 2020-03-26 ENCOUNTER — Ambulatory Visit: Payer: BC Managed Care – PPO | Admitting: Family Medicine

## 2020-03-26 ENCOUNTER — Other Ambulatory Visit: Payer: Self-pay

## 2020-03-26 ENCOUNTER — Encounter: Payer: Self-pay | Admitting: Family Medicine

## 2020-03-26 VITALS — BP 142/90 | HR 66 | Temp 97.4°F | Resp 18 | Ht 60.0 in | Wt 152.5 lb

## 2020-03-26 DIAGNOSIS — E663 Overweight: Secondary | ICD-10-CM | POA: Diagnosis not present

## 2020-03-26 DIAGNOSIS — I10 Essential (primary) hypertension: Secondary | ICD-10-CM

## 2020-03-26 DIAGNOSIS — R7303 Prediabetes: Secondary | ICD-10-CM

## 2020-03-26 DIAGNOSIS — E785 Hyperlipidemia, unspecified: Secondary | ICD-10-CM

## 2020-03-26 DIAGNOSIS — M25512 Pain in left shoulder: Secondary | ICD-10-CM | POA: Diagnosis not present

## 2020-03-26 LAB — POCT GLYCOSYLATED HEMOGLOBIN (HGB A1C)
HbA1c POC (<> result, manual entry): 5.9 % (ref 4.0–5.6)
HbA1c, POC (controlled diabetic range): 5.9 % (ref 0.0–7.0)
HbA1c, POC (prediabetic range): 5.9 % (ref 5.7–6.4)
Hemoglobin A1C: 5.9 % — AB (ref 4.0–5.6)

## 2020-03-26 MED ORDER — LISINOPRIL-HYDROCHLOROTHIAZIDE 10-12.5 MG PO TABS: 1.0000 | ORAL_TABLET | Freq: Every day | ORAL | 1 refills | Status: DC

## 2020-03-26 NOTE — Patient Instructions (Addendum)
Physical due 09/26/2020- we will collect your fasting labs that day as well.  Your A1c today was 5.9> better (last was 6.1) Your BP has been elevated last few appts above goal. Next appt bring your home Blood pressure cuff with you and we will compare them to make sure it is accurate.

## 2020-03-26 NOTE — Progress Notes (Signed)
Patient ID: Gloria [name], female  DOB: 1956-03-04, 64 y.o.   MRN: [id number] Patient Care Team    Relationship Specialty Notifications Start End  Ma Hillock, DO PCP - General Family Medicine  08/20/15   Princess Bruins, MD Consulting Physician Obstetrics and Gynecology  09/24/18     Chief Complaint  Patient presents with  . Hypertension    Subjective: Gloria [name] is a 64 y.o.  Female  present for [facility] Hypertension/HLD/obesity:  Pt reports  compliance with lisinopril -HCTZ 10-12.5 mg daily. Blood pressures ranges at home normal-mostly 120s/70s.Patient denies chest pain, shortness of breath, dizziness or lower extremity edema.  Pt takes a daily baby ASA.  She is tolerating statin.  Labs utd 09/2019 Diet: Tries to follow a low-salt, low saturated, low sugar diet. Exercise: Does not exercise routinely RF: Hypertension, hyperlipidemia, obesity, history of heart disease in her father.  Diabetes: A1c 5.9 >6.1 09/2019.    She has tried modifying her diet.    Depression screen [facility] 2/9 09/26/2019 03/11/2019 09/24/2018 07/23/2018 02/12/2018  Decreased Interest 0 0 0 0 0  Down, Depressed, Hopeless 0 0 0 0 0  PHQ - 2 Score 0 0 0 0 0   No flowsheet data found.   Immunization History  Administered Date(s) Administered  . Influenza,inj,quad, With Preservative 08/24/2019  . Influenza-Unspecified 10/03/2014, 09/04/2015, 08/17/2018  . Td 11/04/2007  . Tdap 09/24/2018  . Zoster Recombinat (Shingrix) 09/24/2018, 12/06/2018     Past Medical History:  Diagnosis Date  . Allergy   . High cholesterol   . Hypertension   . Nipple dermatitis 01/18/2016  . Seasonal allergies    No Known Allergies Past Surgical History:  Procedure Laterality Date  . NO PAST SURGERIES     Family History  Problem Relation Age of Onset  . Heart disease Father   . Alcohol abuse Brother   . Kidney disease Brother    Social History   Social History Narrative   Gloria [name] is married.  She lives with  husband & adopted daughter. She works FT at [employer].   Smoke detector in the home, wears her seatbelt.     Allergies as of 03/26/2020   No Known Allergies     Medication List       Accurate as of Mar 26, 2020 11:59 PM. If you have any questions, ask your nurse or doctor.        aspirin 81 MG chewable tablet Chew by mouth daily. One tablet every other day   atorvastatin 40 MG tablet Commonly known as: LIPITOR Take 1 tablet (40 mg total) by mouth daily.   cholecalciferol 25 MCG (1000 UNIT) tablet Commonly known as: VITAMIN D3 Take 1,000 Units by mouth daily.   lisinopril-hydrochlorothiazide 10-12.5 MG tablet Commonly known as: ZESTORETIC Take 1 tablet by mouth daily. Office visit needed for additional refills       All past medical history, surgical history, allergies, family history, immunizations andmedications were updated in the EMR today and reviewed under the history and medication portions of their EMR.     ROS: 14 pt review of systems performed and negative (unless mentioned in an HPI)  Objective: BP (!) 142/90 (BP Location: Left Arm, Patient Position: Sitting, Cuff Size: Normal)   Pulse 66   Temp (!) 97.4 F (36.3 C) (Temporal)   Resp 18   Ht 5' (1.524 m)   Wt 152 lb 8 oz (69.2 kg)   SpO2 98%   BMI  29.78 kg/m  Gen: Afebrile. No acute distress.  Pleasant female HENT: AT. [place].  Eyes:Pupils Equal Round Reactive to light, Extraocular movements intact,  Conjunctiva without redness, discharge or icterus. CV: RRR no murmur, no edema, +2/4 P posterior tibialis pulses Chest: CTAB, no wheeze or crackles Neuro: Normal gait. PERLA. EOMi. Alert. Oriented.  Psych: Normal affect, dress and demeanor. Normal speech. Normal thought content and judgment.  No exam data present  Assessment/plan: Gloria [name] is a 64 y.o. female present for CPE Essential hypertension/hyperlipidemia/overweight -Continue lisinopril-HCTZ 10-12.5 mg daily.  -She will bring  in her blood pressure cuff to her next appointment for comparison.  Hesitant to increase regimen when she is seeing 120/70's BP at home.  She has also checked at her employment and has had 120/70's as well. -Continue lipitor.   - low sodium diet, exercise.  - f/u 6 mos  Prediabetes A1c is 5.9 today.   -Continue the dietary modifications and increase her exercise. -She is having steroid injections secondary to knee discomfort and shoulder discomfort.  Fortunately she is changing jobs over the next month and will have less physical demanding responsibilities   Return in about 6 months (around 09/26/2020) for CPE (30 min).   Orders Placed This Encounter  Procedures  . POCT glycosylated hemoglobin (Hb A1C)   Meds ordered this encounter  Medications  . lisinopril-hydrochlorothiazide (ZESTORETIC) 10-12.5 MG tablet    Sig: Take 1 tablet by mouth daily. Office visit needed for additional refills    Dispense:  90 tablet    Refill:  1      Electronically signed by: Howard Pouch, DO [place]

## 2020-03-28 DIAGNOSIS — M25512 Pain in left shoulder: Secondary | ICD-10-CM | POA: Diagnosis not present

## 2020-03-28 DIAGNOSIS — M25561 Pain in right knee: Secondary | ICD-10-CM | POA: Diagnosis not present

## 2020-03-28 DIAGNOSIS — M25562 Pain in left knee: Secondary | ICD-10-CM | POA: Diagnosis not present

## 2020-03-28 DIAGNOSIS — M545 Low back pain: Secondary | ICD-10-CM | POA: Diagnosis not present

## 2020-04-03 DIAGNOSIS — M25512 Pain in left shoulder: Secondary | ICD-10-CM | POA: Diagnosis not present

## 2020-04-05 DIAGNOSIS — M25512 Pain in left shoulder: Secondary | ICD-10-CM | POA: Diagnosis not present

## 2020-04-09 DIAGNOSIS — M25512 Pain in left shoulder: Secondary | ICD-10-CM | POA: Diagnosis not present

## 2020-04-11 DIAGNOSIS — M25512 Pain in left shoulder: Secondary | ICD-10-CM | POA: Diagnosis not present

## 2020-04-12 DIAGNOSIS — M545 Low back pain, unspecified: Secondary | ICD-10-CM | POA: Insufficient documentation

## 2020-04-12 DIAGNOSIS — M25561 Pain in right knee: Secondary | ICD-10-CM | POA: Diagnosis not present

## 2020-04-24 DIAGNOSIS — H90A32 Mixed conductive and sensorineural hearing loss, unilateral, left ear with restricted hearing on the contralateral side: Secondary | ICD-10-CM | POA: Diagnosis not present

## 2020-04-24 DIAGNOSIS — H90A21 Sensorineural hearing loss, unilateral, right ear, with restricted hearing on the contralateral side: Secondary | ICD-10-CM | POA: Diagnosis not present

## 2020-04-24 DIAGNOSIS — H7292 Unspecified perforation of tympanic membrane, left ear: Secondary | ICD-10-CM | POA: Diagnosis not present

## 2020-04-25 DIAGNOSIS — M545 Low back pain: Secondary | ICD-10-CM | POA: Diagnosis not present

## 2020-05-11 DIAGNOSIS — M5431 Sciatica, right side: Secondary | ICD-10-CM | POA: Diagnosis not present

## 2020-06-25 DIAGNOSIS — H90A32 Mixed conductive and sensorineural hearing loss, unilateral, left ear with restricted hearing on the contralateral side: Secondary | ICD-10-CM | POA: Diagnosis not present

## 2020-06-25 DIAGNOSIS — H7292 Unspecified perforation of tympanic membrane, left ear: Secondary | ICD-10-CM | POA: Diagnosis not present

## 2020-06-28 DIAGNOSIS — Z03818 Encounter for observation for suspected exposure to other biological agents ruled out: Secondary | ICD-10-CM | POA: Diagnosis not present

## 2020-07-03 DIAGNOSIS — H7202 Central perforation of tympanic membrane, left ear: Secondary | ICD-10-CM | POA: Diagnosis not present

## 2020-07-03 DIAGNOSIS — H902 Conductive hearing loss, unspecified: Secondary | ICD-10-CM | POA: Diagnosis not present

## 2020-09-26 ENCOUNTER — Encounter: Payer: BC Managed Care – PPO | Admitting: Family Medicine

## 2020-09-26 DIAGNOSIS — M5416 Radiculopathy, lumbar region: Secondary | ICD-10-CM | POA: Diagnosis not present

## 2020-09-26 DIAGNOSIS — M545 Low back pain, unspecified: Secondary | ICD-10-CM | POA: Diagnosis not present

## 2020-10-01 ENCOUNTER — Ambulatory Visit (INDEPENDENT_AMBULATORY_CARE_PROVIDER_SITE_OTHER): Payer: Medicare HMO | Admitting: Family Medicine

## 2020-10-01 ENCOUNTER — Other Ambulatory Visit: Payer: Self-pay

## 2020-10-01 ENCOUNTER — Encounter: Payer: Self-pay | Admitting: Family Medicine

## 2020-10-01 VITALS — BP 145/82 | HR 64 | Temp 98.4°F | Ht <= 58 in | Wt 154.0 lb

## 2020-10-01 DIAGNOSIS — R7303 Prediabetes: Secondary | ICD-10-CM | POA: Diagnosis not present

## 2020-10-01 DIAGNOSIS — E559 Vitamin D deficiency, unspecified: Secondary | ICD-10-CM

## 2020-10-01 DIAGNOSIS — E785 Hyperlipidemia, unspecified: Secondary | ICD-10-CM | POA: Diagnosis not present

## 2020-10-01 DIAGNOSIS — Z0001 Encounter for general adult medical examination with abnormal findings: Secondary | ICD-10-CM

## 2020-10-01 DIAGNOSIS — Z1211 Encounter for screening for malignant neoplasm of colon: Secondary | ICD-10-CM | POA: Diagnosis not present

## 2020-10-01 DIAGNOSIS — M8589 Other specified disorders of bone density and structure, multiple sites: Secondary | ICD-10-CM

## 2020-10-01 DIAGNOSIS — I10 Essential (primary) hypertension: Secondary | ICD-10-CM

## 2020-10-01 DIAGNOSIS — Z1231 Encounter for screening mammogram for malignant neoplasm of breast: Secondary | ICD-10-CM | POA: Diagnosis not present

## 2020-10-01 DIAGNOSIS — E669 Obesity, unspecified: Secondary | ICD-10-CM | POA: Diagnosis not present

## 2020-10-01 LAB — VITAMIN D 25 HYDROXY (VIT D DEFICIENCY, FRACTURES): VITD: 45.06 ng/mL (ref 30.00–100.00)

## 2020-10-01 LAB — CBC
HCT: 36.7 % (ref 36.0–46.0)
Hemoglobin: 12.5 g/dL (ref 12.0–15.0)
MCHC: 34 g/dL (ref 30.0–36.0)
MCV: 87.7 fl (ref 78.0–100.0)
Platelets: 258 10*3/uL (ref 150.0–400.0)
RBC: 4.18 Mil/uL (ref 3.87–5.11)
RDW: 12.5 % (ref 11.5–15.5)
WBC: 6.1 10*3/uL (ref 4.0–10.5)

## 2020-10-01 LAB — COMPREHENSIVE METABOLIC PANEL
ALT: 17 U/L (ref 0–35)
AST: 18 U/L (ref 0–37)
Albumin: 4.4 g/dL (ref 3.5–5.2)
Alkaline Phosphatase: 58 U/L (ref 39–117)
BUN: 12 mg/dL (ref 6–23)
CO2: 28 mEq/L (ref 19–32)
Calcium: 9.1 mg/dL (ref 8.4–10.5)
Chloride: 94 mEq/L — ABNORMAL LOW (ref 96–112)
Creatinine, Ser: 0.56 mg/dL (ref 0.40–1.20)
GFR: 95.85 mL/min (ref >60.00)
Glucose, Bld: 96 mg/dL (ref 70–99)
Potassium: 4 mEq/L (ref 3.5–5.1)
Sodium: 130 mEq/L — ABNORMAL LOW (ref 135–145)
Total Bilirubin: 0.7 mg/dL (ref 0.2–1.2)
Total Protein: 7 g/dL (ref 6.0–8.3)

## 2020-10-01 LAB — LIPID PANEL
Cholesterol: 199 mg/dL (ref 0–200)
HDL: 52.5 mg/dL (ref >39.00)
LDL Cholesterol: 109 mg/dL — ABNORMAL HIGH (ref 0–99)
NonHDL: 146.48
Total CHOL/HDL Ratio: 4
Triglycerides: 185 mg/dL — ABNORMAL HIGH (ref 0.0–149.0)
VLDL: 37 mg/dL (ref 0.0–40.0)

## 2020-10-01 LAB — HEMOGLOBIN A1C: Hgb A1c MFr Bld: 6.1 % (ref 4.6–6.5)

## 2020-10-01 LAB — TSH: TSH: 0.92 u[IU]/mL (ref 0.35–4.50)

## 2020-10-01 MED ORDER — ATORVASTATIN CALCIUM 40 MG PO TABS: 40.0000 mg | ORAL_TABLET | Freq: Every day | ORAL | 3 refills | Status: DC

## 2020-10-01 MED ORDER — LISINOPRIL-HYDROCHLOROTHIAZIDE 20-12.5 MG PO TABS
1.0000 | ORAL_TABLET | Freq: Every day | ORAL | 1 refills | Status: DC
Start: 2020-10-01 — End: 2021-03-29

## 2020-10-01 NOTE — Progress Notes (Signed)
This visit occurred during the SARS-CoV-2 public health emergency.  Safety protocols were in place, including screening questions prior to the visit, additional usage of staff PPE, and extensive cleaning of exam room while observing appropriate contact time as indicated for disinfecting solutions.    Patient ID: Gloria [name], female  DOB: Nov 26, 1955, 63 y.o.   MRN: [id number] Patient Care Team    Relationship Specialty Notifications Start End  Ma Hillock, DO PCP - General Family Medicine  08/20/15   Princess Bruins, MD Consulting Physician Obstetrics and Gynecology  09/24/18     Chief Complaint  Patient presents with  . Annual Exam    pt is fasting; may want cologaurd    Subjective:  Grabiela [name] is a 64 y.o.  Female  present for CPE . All past medical history, surgical history, allergies, family history, immunizations, medications and social history were updated in the electronic medical record today. All recent labs, ED visits and hospitalizations within the last year were reviewed.  Health maintenance:  Colonoscopy:never, no fhx.Pt has declined to schedule in the past> she is open to cologuard testing.> ordered.  Mammogram:no fhx,completed: 03/12/2020,Normal --  Breast center> ordered Cervical cancer screening: last pap:01/2019. Immunizations: tdapupdated today 09/2018, InfluenzaUTD 08/2019(encouraged yearly)> pt declines today, Shingrix series completed 2020, declines covid vaccine. prevnar declined today Infectious disease screening: HIVandHep C completed DEXA:01/07/2019, rpt 5 yr Assistive device: none Oxygen DTO:IZTI Patient has a Dental home. Hospitalizations/ED visits: reviewed  Hypertension/HLD/obesity: Pt reports complaincewith lisinopril -HCTZ 10-12.5 mg daily. Blood pressures ranges at home 458K systolic. Patient denies chest pain, shortness of breath, dizziness or lower extremity edema.  Pttakes adaily baby ASA.  She is tolerating statin.   Diet:Tries to follow a low-salt, low saturated, low sugar diet. Exercise:Does not exercise routinely DX:[id number], hyperlipidemia, obesity, history of heart disease in her father.  Diabetes: She has tried modifying her diet.   Depression screen [facility] 2/9 10/01/2020 09/26/2019 03/11/2019 09/24/2018 07/23/2018  Decreased Interest 0 0 0 0 0  Down, Depressed, Hopeless 0 0 0 0 0  PHQ - 2 Score 0 0 0 0 0   No flowsheet data found.   Immunization History  Administered Date(s) Administered  . Influenza,inj,quad, With Preservative 08/24/2019  . Influenza-Unspecified 10/03/2014, 09/04/2015, 08/17/2018  . Td 11/04/2007  . Tdap 09/24/2018  . Zoster Recombinat (Shingrix) 09/24/2018, 12/06/2018   Past Medical History:  Diagnosis Date  . Allergy   . High cholesterol   . Hypertension   . Nipple dermatitis 01/18/2016  . Seasonal allergies    No Known Allergies Past Surgical History:  Procedure Laterality Date  . INNER EAR SURGERY    . NO PAST SURGERIES     Family History  Problem Relation Age of Onset  . Heart disease Father   . Alcohol abuse Brother   . Kidney disease Brother    Social History   Social History Narrative   Ms Spiewak is married.  She lives with husband & adopted daughter. She works FT at [employer].   Smoke detector in the home, wears her seatbelt.     Allergies as of 10/01/2020   No Known Allergies     Medication List       Accurate as of October 01, 2020 10:04 AM. If you have any questions, ask your nurse or doctor.        STOP taking these medications   lisinopril-hydrochlorothiazide 10-12.5 MG tablet Commonly known as: ZESTORETIC Replaced by: lisinopril-hydrochlorothiazide 20-12.5 MG tablet Stopped by: Joseph Art  Jasir Rother, DO     TAKE these medications   aspirin 81 MG chewable tablet Chew by mouth daily. One tablet every other day   atorvastatin 40 MG tablet Commonly known as: LIPITOR Take 1 tablet (40 mg total) by mouth  daily.   cholecalciferol 25 MCG (1000 UNIT) tablet Commonly known as: VITAMIN D3 Take 1,000 Units by mouth daily.   lisinopril-hydrochlorothiazide 20-12.5 MG tablet Commonly known as: Zestoretic Take 1 tablet by mouth daily. Replaces: lisinopril-hydrochlorothiazide 10-12.5 MG tablet Started by: Howard Pouch, DO   meloxicam 15 MG tablet Commonly known as: MOBIC   methocarbamol 500 MG tablet Commonly known as: ROBAXIN       All past medical history, surgical history, allergies, family history, immunizations andmedications were updated in the EMR today and reviewed under the history and medication portions of their EMR.     No results found for this or any previous visit (from the past 2160 hour(s)).  MM 3D SCREEN BREAST BILATERAL  Result Date: 03/12/2020 CLINICAL DATA:  Screening. EXAM: DIGITAL SCREENING BILATERAL MAMMOGRAM WITH TOMO AND CAD COMPARISON:  Previous exam(s). ACR Breast Density Category c: The breast tissue is heterogeneously dense, which may obscure small masses. FINDINGS: There are no findings suspicious for malignancy. Images were processed with CAD. IMPRESSION: No mammographic evidence of malignancy. A result letter of this screening mammogram will be mailed directly to the patient. RECOMMENDATION: Screening mammogram in one year. (Code:SM-B-01Y) BI-RADS CATEGORY  1: Negative. Electronically Signed   By: Kristopher Oppenheim M.D.   On: 03/12/2020 08:53     ROS: 14 pt review of systems performed and negative (unless mentioned in an HPI)  Objective: BP (!) 145/82   Pulse 64   Temp 98.4 F (36.9 C) (Oral)   Ht 4' 10"  (1.473 m)   Wt 154 lb (69.9 kg)   SpO2 98%   BMI 32.19 kg/m  Gen: Afebrile. No acute distress. Nontoxic in appearance, well-developed, well-nourished,  Pleasant obese female.  HENT: AT. [place]. Bilateral TM visualized and normal in appearance, normal external auditory canal. MMM, no oral lesions, adequate dentition. Bilateral nares within normal limits.  Throat without erythema, ulcerations or exudates. No Cough on exam, no hoarseness on exam. Eyes:Pupils Equal Round Reactive to light, Extraocular movements intact,  Conjunctiva without redness, discharge or icterus. Neck/lymp/endocrine: Supple,no lymphadenopathy, no thyromegaly CV: RRR no murmur, no edema, +2/4 P posterior tibialis pulses.  Chest: CTAB, no wheeze, rhonchi or crackles. normal Respiratory effort. good Air movement. Abd: Soft. flat. NTND. BS present. no Masses palpated. No hepatosplenomegaly. No rebound tenderness or guarding. Skin: no rashes, purpura or petechiae. Warm and well-perfused. Skin intact. Neuro/Msk:  Normal gait. PERLA. EOMi. Alert. Oriented x3.  Cranial nerves II through XII intact. Muscle strength 5/5 upper/lower extremity. DTRs equal bilaterally. Psych: Normal affect, dress and demeanor. Normal speech. Normal thought content and judgment.   No exam data present  Assessment/plan: Kynadi Dragos is a 64 y.o. female present for CPE  Essential hypertension/hyperlipidemia/overweight - elevated today and home pressures are elevated.  - increase lisinopril-HCTZ 10 > 20 mg -12.5 mg daily, refills provided today - continue  lipitor.  - low sodium, exercise.  - cbc, cmp, tsh, lipid - f/u 5.5 mos Prediabetes A1c collected today -Continue the dietary modifications and increase her exercise. a1c ordered today Vitamin D deficiency - Vitamin D (25 hydroxy) collected today - taking supplement.   Breast cancer screening by mammogram - MM 3D SCREEN BREAST BILATERAL; Future Colon cancer screening - Cologuard Encounter for general  adult medical examination with abnormal findings Patient was encouraged to exercise greater than 150 minutes a week. Patient was encouraged to choose a diet filled with fresh fruits and vegetables, and lean meats. AVS provided to patient today for education/recommendation on gender specific health and safety maintenance. Colonoscopy:never, no  fhx.Pt has declined to schedule in the past> she is open to cologuard testing.> ordered.  Mammogram:no fhx,completed: 03/12/2020,Normal --  Breast center> ordered Cervical cancer screening: last pap:01/2019. Immunizations: tdapupdated today 09/2018, InfluenzaUTD 08/2019(encouraged yearly)> pt declines today, Shingrix series completed 2020, declines covid vaccine. prevnar declined today Infectious disease screening: HIVandHep C completed DEXA:01/07/2019, rpt 5 yr Return in about 23 weeks (around 03/11/2021) for [place] (30 min).    Orders Placed This Encounter  Procedures  . MM 3D SCREEN BREAST BILATERAL  . CBC  . Comp Met (CMET)  . TSH  . Lipid panel  . Hemoglobin A1c  . Vitamin D (25 hydroxy)  . Cologuard   Meds ordered this encounter  Medications  . atorvastatin (LIPITOR) 40 MG tablet    Sig: Take 1 tablet (40 mg total) by mouth daily.    Dispense:  90 tablet    Refill:  3  . lisinopril-hydrochlorothiazide (ZESTORETIC) 20-12.5 MG tablet    Sig: Take 1 tablet by mouth daily.    Dispense:  90 tablet    Refill:  1   Referral Orders  No referral(s) requested today     Electronically signed by: Howard Pouch, [place]

## 2020-10-01 NOTE — Patient Instructions (Signed)

## 2020-10-02 DIAGNOSIS — M5441 Lumbago with sciatica, right side: Secondary | ICD-10-CM | POA: Diagnosis not present

## 2020-10-12 DIAGNOSIS — H90A21 Sensorineural hearing loss, unilateral, right ear, with restricted hearing on the contralateral side: Secondary | ICD-10-CM | POA: Diagnosis not present

## 2020-10-12 DIAGNOSIS — H7292 Unspecified perforation of tympanic membrane, left ear: Secondary | ICD-10-CM | POA: Diagnosis not present

## 2020-10-12 DIAGNOSIS — H90A32 Mixed conductive and sensorineural hearing loss, unilateral, left ear with restricted hearing on the contralateral side: Secondary | ICD-10-CM | POA: Diagnosis not present

## 2020-11-05 DIAGNOSIS — Z1211 Encounter for screening for malignant neoplasm of colon: Secondary | ICD-10-CM | POA: Diagnosis not present

## 2020-11-14 LAB — COLOGUARD
COLOGUARD: POSITIVE — AB
Cologuard: POSITIVE — AB

## 2020-11-15 ENCOUNTER — Telehealth: Payer: Self-pay | Admitting: Family Medicine

## 2020-11-15 DIAGNOSIS — R195 Other fecal abnormalities: Secondary | ICD-10-CM | POA: Insufficient documentation

## 2020-11-15 HISTORY — DX: Other fecal abnormalities: R19.5

## 2020-11-15 NOTE — Telephone Encounter (Signed)
Please inform patient of positive cologuard testing. A positive result does not necessarily mean cancer, but could be cancer. It means that Cologuard detected DNA and/or  biomarkers in the stool which are associated with colon cancer or precancer. Patients with a positive result require a diagnostic colonoscopy. Not investigating further now, could result in missed opportunity to diagnose and treat colon cancer or precancer.   - I have placed a referral to gastroenterology urgently.  She should be receiving a call from them to schedule.

## 2020-11-15 NOTE — Telephone Encounter (Signed)
LVM for pt husband to CB regarding results.  

## 2020-11-15 NOTE — Telephone Encounter (Signed)
LVM for pt to CB regarding results.  

## 2020-11-16 NOTE — Telephone Encounter (Signed)
LVM for pt to CB regarding results.  

## 2020-11-19 NOTE — Telephone Encounter (Signed)
LVM for pt to CB regarding results.  

## 2020-11-20 NOTE — Telephone Encounter (Signed)
Letter sent.

## 2020-11-28 DIAGNOSIS — U071 COVID-19: Secondary | ICD-10-CM | POA: Diagnosis not present

## 2020-12-11 ENCOUNTER — Telehealth: Payer: Self-pay

## 2020-12-11 NOTE — Telephone Encounter (Signed)
Patient calling for Cologuard results.  Please call patient back at [phone].  Thank you

## 2020-12-11 NOTE — Telephone Encounter (Signed)
Attempted to call pt and could not leave VM

## 2020-12-11 NOTE — Telephone Encounter (Signed)
Patient advised and voiced understanding.  

## 2021-01-08 ENCOUNTER — Telehealth: Payer: Self-pay | Admitting: Family Medicine

## 2021-01-08 DIAGNOSIS — R195 Other fecal abnormalities: Secondary | ICD-10-CM

## 2021-01-08 NOTE — Telephone Encounter (Signed)
Please inform patient of positive cologuard testing. A positive result does not necessarily mean cancer. It means that Cologuard detected DNA and/or  biomarkers in the stool which are associated with colon cancer or precancer. Patients with a positive result should have a diagnostic colonoscopy. Not investigating further now, could result in missed opportunity to diagnose and treat colon cancer or precancer. I have placed the referral to GI- please provide her with the contact info to [facility] to call to get scheduled for further evaluation.  Thanks.

## 2021-01-08 NOTE — Telephone Encounter (Signed)
LVM for pt to CB regarding results.  

## 2021-01-09 NOTE — Telephone Encounter (Signed)
Spoke with pt regarding labs and instructions. Pt has appt with GI in [place]

## 2021-01-09 NOTE — Telephone Encounter (Signed)
LVM for pt to CB regarding results.  

## 2021-01-15 DIAGNOSIS — R195 Other fecal abnormalities: Secondary | ICD-10-CM | POA: Diagnosis not present

## 2021-03-11 ENCOUNTER — Ambulatory Visit: Payer: Medicare HMO | Admitting: Family Medicine

## 2021-03-11 DIAGNOSIS — R7303 Prediabetes: Secondary | ICD-10-CM

## 2021-03-11 DIAGNOSIS — E669 Obesity, unspecified: Secondary | ICD-10-CM

## 2021-03-11 DIAGNOSIS — E785 Hyperlipidemia, unspecified: Secondary | ICD-10-CM

## 2021-03-11 DIAGNOSIS — I10 Essential (primary) hypertension: Secondary | ICD-10-CM

## 2021-03-11 DIAGNOSIS — E559 Vitamin D deficiency, unspecified: Secondary | ICD-10-CM

## 2021-03-20 DIAGNOSIS — D123 Benign neoplasm of transverse colon: Secondary | ICD-10-CM | POA: Diagnosis not present

## 2021-03-20 DIAGNOSIS — D122 Benign neoplasm of ascending colon: Secondary | ICD-10-CM | POA: Diagnosis not present

## 2021-03-20 DIAGNOSIS — K635 Polyp of colon: Secondary | ICD-10-CM | POA: Diagnosis not present

## 2021-03-20 DIAGNOSIS — K5730 Diverticulosis of large intestine without perforation or abscess without bleeding: Secondary | ICD-10-CM | POA: Diagnosis not present

## 2021-03-20 DIAGNOSIS — R195 Other fecal abnormalities: Secondary | ICD-10-CM | POA: Diagnosis not present

## 2021-03-20 LAB — HM COLONOSCOPY

## 2021-03-29 ENCOUNTER — Other Ambulatory Visit: Payer: Self-pay | Admitting: Family Medicine

## 2021-04-26 DIAGNOSIS — Z9889 Other specified postprocedural states: Secondary | ICD-10-CM | POA: Diagnosis not present

## 2021-04-26 DIAGNOSIS — H60392 Other infective otitis externa, left ear: Secondary | ICD-10-CM | POA: Diagnosis not present

## 2021-04-26 DIAGNOSIS — L729 Follicular cyst of the skin and subcutaneous tissue, unspecified: Secondary | ICD-10-CM | POA: Diagnosis not present

## 2021-05-02 ENCOUNTER — Other Ambulatory Visit: Payer: Self-pay | Admitting: Family Medicine

## 2021-05-10 ENCOUNTER — Encounter: Payer: Self-pay | Admitting: Family Medicine

## 2021-05-10 ENCOUNTER — Other Ambulatory Visit: Payer: Self-pay

## 2021-05-10 ENCOUNTER — Ambulatory Visit (INDEPENDENT_AMBULATORY_CARE_PROVIDER_SITE_OTHER): Payer: Medicare HMO | Admitting: Family Medicine

## 2021-05-10 VITALS — BP 130/71 | HR 79 | Temp 97.9°F | Ht <= 58 in | Wt 156.0 lb

## 2021-05-10 DIAGNOSIS — E785 Hyperlipidemia, unspecified: Secondary | ICD-10-CM | POA: Diagnosis not present

## 2021-05-10 DIAGNOSIS — R7303 Prediabetes: Secondary | ICD-10-CM | POA: Diagnosis not present

## 2021-05-10 DIAGNOSIS — I10 Essential (primary) hypertension: Secondary | ICD-10-CM

## 2021-05-10 DIAGNOSIS — E669 Obesity, unspecified: Secondary | ICD-10-CM

## 2021-05-10 MED ORDER — LISINOPRIL-HYDROCHLOROTHIAZIDE 20-12.5 MG PO TABS: 1.0000 | ORAL_TABLET | Freq: Every day | ORAL | 1 refills | Status: DC

## 2021-05-10 MED ORDER — ATORVASTATIN CALCIUM 40 MG PO TABS: 40.0000 mg | ORAL_TABLET | Freq: Every day | ORAL | 3 refills | Status: DC

## 2021-05-10 NOTE — Patient Instructions (Signed)
Next appt for your physical 10/02/2021 or later. Come fasting to this appt. - we will do all your labs.     Great to see you today.  I have refilled the medication(s) we provide.   If labs were collected, we will inform you of lab results once received either by echart message or telephone call.   - echart message- for normal results that have been seen by the patient already.   - telephone call: abnormal results or if patient has not viewed results in their echart.

## 2021-05-10 NOTE — Progress Notes (Signed)
This visit occurred during the SARS-CoV-2 public health emergency.  Safety protocols were in place, including screening questions prior to the visit, additional usage of staff PPE, and extensive cleaning of exam room while observing appropriate contact time as indicated for disinfecting solutions.    Patient ID: Gloria [name], female  DOB: 1956-10-21, 65 y.o.   MRN: [id number] Patient Care Team    Relationship Specialty Notifications Start End  Ma Hillock, DO PCP - General Family Medicine  08/20/15   Princess Bruins, MD Consulting Physician Obstetrics and Gynecology  09/24/18     Chief Complaint  Patient presents with   Hypertension    Pt is not fasting    Subjective: Gloria [name] is a 65 y.o.  Female  present for cmc . All past medical history, surgical history, allergies, family history, immunizations, medications and social history were updated in the electronic medical record today. All recent labs, ED visits and hospitalizations within the last year were reviewed.   Hypertension/HLD/obesity:  Pt reports  complaince with lisinopril -HCTZ 10-12.5 mg daily. Patient denies chest pain, shortness of breath, dizziness or lower extremity edema.  Pt takes a daily baby ASA.  She is tolerating statin.  Diet: Tries to follow a low-salt, low saturated, low sugar diet. Exercise: Does not exercise routinely RF: Hypertension, hyperlipidemia, obesity, history of heart disease in her father.   Diabetes: She has tried modifying her diet.    Depression screen [facility] 2/9 10/01/2020 09/26/2019 03/11/2019 09/24/2018 07/23/2018  Decreased Interest 0 0 0 0 0  Down, Depressed, Hopeless 0 0 0 0 0  PHQ - 2 Score 0 0 0 0 0   No flowsheet data found.   Immunization History  Administered Date(s) Administered   Influenza,inj,quad, With Preservative 08/24/2019   Influenza-Unspecified 10/03/2014, 09/04/2015, 08/17/2018   Td 11/04/2007   Tdap 09/24/2018   Zoster Recombinat (Shingrix) 09/24/2018,  12/06/2018   Past Medical History:  Diagnosis Date   Allergy    High cholesterol    Hypertension    Nipple dermatitis 01/18/2016   Seasonal allergies    No Known Allergies Past Surgical History:  Procedure Laterality Date   INNER EAR SURGERY     NO PAST SURGERIES     Family History  Problem Relation Age of Onset   Heart disease Father    Alcohol abuse Brother    Kidney disease Brother    Social History   Social History Narrative   Ms Negrete is married.  She lives with husband & adopted daughter. She works FT at [employer].   Smoke detector in the home, wears her seatbelt.     Allergies as of 05/10/2021   No Known Allergies      Medication List        Accurate as of May 10, 2021  5:37 PM. If you have any questions, ask your nurse or doctor.          STOP taking these medications    meloxicam 15 MG tablet Commonly known as: MOBIC Stopped by: Howard Pouch, DO   methocarbamol 500 MG tablet Commonly known as: ROBAXIN Stopped by: Howard Pouch, DO       TAKE these medications    aspirin 81 MG chewable tablet Chew by mouth daily. One tablet every other day   atorvastatin 40 MG tablet Commonly known as: LIPITOR Take 1 tablet (40 mg total) by mouth daily.   cholecalciferol 25 MCG (1000 UNIT) tablet Commonly known as: VITAMIN D3 Take 1,000 Units  by mouth daily.   lisinopril-hydrochlorothiazide 20-12.5 MG tablet Commonly known as: ZESTORETIC Take 1 tablet by mouth daily.        All past medical history, surgical history, allergies, family history, immunizations andmedications were updated in the EMR today and reviewed under the history and medication portions of their EMR.     Recent Results (from the past 2160 hour(s))  HM COLONOSCOPY     Status: None   Collection Time: 03/20/21 12:00 AM  Result Value Ref Range   HM Colonoscopy See Report (in chart) See Report (in chart), Patient Reported     ROS: 14 pt review of systems  performed and negative (unless mentioned in an HPI)  Objective: BP 130/71   Pulse 79   Temp 97.9 F (36.6 C) (Oral)   Ht 4\' 10"  (1.473 m)   Wt 156 lb (70.8 kg)   SpO2 95%   BMI 32.60 kg/m  Gen: Afebrile. No acute distress.  HENT: AT. [place].  Neck/lymp/endocrine: Supple,no lymphadenopathy, no thyromegaly CV: RRR no  murmur, no edema.  Chest: CTAB, no wheeze or crackles Skin: no rashes, purpura or petechiae.  Neuro: Normal gait. PERLA. EOMi. Alert. Oriented. x3 Psych: Normal affect, dress and demeanor. Normal speech. Normal thought content and judgment.   No results found.  Assessment/plan: Gloria [name] is a 65 y.o. female present for  Essential hypertension/hyperlipidemia/overweight -Improved -Continue lisinopril-HCTZ 20 mg -12.5 mg daily, -Continue lipitor.   - low sodium, exercise.  - cbc, cmp, tsh, lipid> due next appointment - f/u 5.5 mos Prediabetes Last A1c 6.1 with a normal fasting glucose.  A1c due next appointment  Vitamin D deficiency Labs up-to-date and patient is taking her supplement.  Labs due next appointment    Return in about 5 months (around 10/02/2021) for CPE (30 min), [facility] (30 min).    Orders Placed This Encounter  Procedures   HM COLONOSCOPY   Meds ordered this encounter  Medications   lisinopril-hydrochlorothiazide (ZESTORETIC) 20-12.5 MG tablet    Sig: Take 1 tablet by mouth daily.    Dispense:  90 tablet    Refill:  1   atorvastatin (LIPITOR) 40 MG tablet    Sig: Take 1 tablet (40 mg total) by mouth daily.    Dispense:  90 tablet    Refill:  3    Referral Orders  No referral(s) requested today     Electronically signed by: Howard Pouch, [place]

## 2021-05-13 ENCOUNTER — Encounter: Payer: Self-pay | Admitting: Family Medicine

## 2021-06-12 ENCOUNTER — Telehealth: Payer: Self-pay

## 2021-06-12 ENCOUNTER — Telehealth: Payer: Self-pay | Admitting: Family Medicine

## 2021-06-12 NOTE — Telephone Encounter (Signed)
[name], Gloria 19 minutes ago (9:47 AM)  Patient states she is returning call from Dr. Raoul Pitch.   Patient can be reached at [phone]

## 2021-06-12 NOTE — Telephone Encounter (Signed)
Patient states she is returning call from Dr. Raoul Pitch.  Patient can be reached at [phone]

## 2021-06-12 NOTE — Telephone Encounter (Signed)
Left message for patient to schedule Annual Wellness Visit.  Please schedule with Nurse Health Advisor Julie Greer, RN at [facility]. Please call [phone] ask for Kathy  

## 2021-06-12 NOTE — Telephone Encounter (Signed)
See other encounter.

## 2021-07-03 ENCOUNTER — Other Ambulatory Visit: Payer: Self-pay | Admitting: *Deleted

## 2021-07-03 ENCOUNTER — Ambulatory Visit (INDEPENDENT_AMBULATORY_CARE_PROVIDER_SITE_OTHER): Payer: Medicare HMO | Admitting: *Deleted

## 2021-07-03 DIAGNOSIS — Z1231 Encounter for screening mammogram for malignant neoplasm of breast: Secondary | ICD-10-CM

## 2021-07-03 DIAGNOSIS — Z780 Asymptomatic menopausal state: Secondary | ICD-10-CM

## 2021-07-03 DIAGNOSIS — Z0000 Encounter for general adult medical examination without abnormal findings: Secondary | ICD-10-CM | POA: Diagnosis not present

## 2021-07-03 NOTE — Progress Notes (Signed)
Subjective:   Gloria [name] is a 65 y.o. female who presents for an Initial Medicare Annual Wellness Visit.  I connected with  Gloria [name] on 07/03/21 by a telephone enabled telemedicine application and verified that I am speaking with the correct person using two identifiers.   I discussed the limitations of evaluation and management by telemedicine. The patient expressed understanding and agreed to proceed.   Review of Systems    NA Cardiac Risk Factors include: advanced age (>38mn, >>33women);hypertension     Objective:    Today's Vitals   There is no height or weight on file to calculate BMI.  Advanced Directives 07/03/2021  Does Patient Have a Medical Advance Directive? No  Would patient like information on creating a medical advance directive? No - Patient declined    Current Medications (verified) Outpatient Encounter Medications as of 07/03/2021  Medication Sig   aspirin 81 MG chewable tablet Chew by mouth daily. One tablet every other day   atorvastatin (LIPITOR) 40 MG tablet Take 1 tablet (40 mg total) by mouth daily.   cholecalciferol (VITAMIN D3) 25 MCG (1000 UT) tablet Take 1,000 Units by mouth daily.   lisinopril-hydrochlorothiazide (ZESTORETIC) 20-12.5 MG tablet Take 1 tablet by mouth daily.   No facility-administered encounter medications on file as of 07/03/2021.    Allergies (verified) Patient has no known allergies.   History: Past Medical History:  Diagnosis Date   Allergy    High cholesterol    Hypertension    Nipple dermatitis 01/18/2016   Seasonal allergies    Past Surgical History:  Procedure Laterality Date   INNER EAR SURGERY     NO PAST SURGERIES     Family History  Problem Relation Age of Onset   Heart disease Father    Alcohol abuse Brother    Kidney disease Brother    Social History   Socioeconomic History   Marital status: Married    Spouse name: Not on file   Number of children: 1   Years of education: 4 yr [place]  Highest  education level: Not on file  Occupational History   Occupation: CNA    Employer: [employer]    Comment: [employer]  Tobacco Use   Smoking status: Never   Smokeless tobacco: Never  Vaping Use   Vaping Use: Never used  Substance and Sexual Activity   Alcohol use: No    Alcohol/week: 0.0 standard drinks   Drug use: No   Sexual activity: Yes    Birth control/protection: Post-menopausal  Other Topics Concern   Not on file  Social History Narrative   Gloria [name] married.  She lives with husband & adopted daughter. She works FT at [employer]   Smoke detector in the home, wears her seatbelt.    Social Determinants of Health   Financial Resource Strain: Low Risk    Difficulty of Paying Living Expenses: Not hard at all  Food Insecurity: No Food Insecurity   Worried About RCharity fundraiserin the Last Year: Never true   [place] the Last Year: Never true  Transportation Needs: No Transportation Needs   Lack of Transportation (Medical): No   Lack of Transportation (Non-Medical): No  Physical Activity: Insufficiently Active   Days of Exercise per Week: 4 days   Minutes of Exercise per Session: 30 min  Stress: No Stress Concern Present   Feeling of Stress : Not at all  Social Connections: Moderately Integrated  Frequency of Communication with Friends and Family: More than three times a week   Frequency of Social Gatherings with Friends and Family: More than three times a week   Attends Religious Services: More than 4 times per year   Active Member of [employer] or Organizations: No   Attends Music therapist: Never   Marital Status: Married    Tobacco Counseling Counseling given: Not Answered   Clinical Intake:  Pre-visit preparation completed: Yes  Pain : No/denies pain     Nutritional Risks: None Diabetes: No  How often do you need to have someone help you when you read instructions, pamphlets, or other written materials from  your doctor or pharmacy?: 1 - Never  Diabetic?no  Interpreter Needed?: No  Information entered by :: Leroy Kennedy LPN   Activities of Daily Living In your present state of health, do you have any difficulty performing the following activities: 07/03/2021  Hearing? N  Difficulty concentrating or making decisions? N  Walking or climbing stairs? Y  Dressing or bathing? N  Doing errands, shopping? N  Preparing Food and eating ? N  Using the Toilet? N  In the past six months, have you accidently leaked urine? N  Do you have problems with loss of bowel control? N  Managing your Medications? N  Managing your Finances? N  Housekeeping or managing your Housekeeping? N  Some recent data might be hidden    Patient Care Team: Ma Hillock, DO as PCP - General (Family Medicine) Princess Bruins, MD as Consulting Physician (Obstetrics and Gynecology)  Indicate any recent Medical Services you may have received from other than [facility] providers in the past year (date may be approximate).     Assessment:   This is a routine wellness examination for Gloria [name].  Hearing/Vision screen Hearing Screening - Comments:: No trouble hearing Vision Screening - Comments:: Not up to date Does not have an eye doctor at this time  Dietary issues and exercise activities discussed: Current Exercise Habits: Home exercise routine, Type of exercise: strength training/weights;walking, Time (Minutes): 40, Frequency (Times/Week): 4, Weekly Exercise (Minutes/Week): 160, Intensity: Moderate   Goals Addressed             This Visit's Progress    Patient Stated       Continue current lifestyle       Depression Screen PHQ 2/9 Scores 07/03/2021 10/01/2020 09/26/2019 03/11/2019 09/24/2018 07/23/2018 02/12/2018  PHQ - 2 Score 0 0 0 0 0 0 0    Fall Risk Fall Risk  07/03/2021 10/01/2020 09/24/2018 07/23/2018 02/12/2018  Falls in the past year? 0 0 0 No No  Number falls in past yr: 0 0 - - -  Injury with Fall? 0 0  - - -  Follow up Falls evaluation completed;Falls prevention discussed Falls evaluation completed Falls evaluation completed - -    FALL RISK PREVENTION PERTAINING TO THE HOME:  Any stairs in or around the home? No  If so, are there any without handrails? No  Home free of loose throw rugs in walkways, pet beds, electrical cords, etc? Yes  Adequate lighting in your home to reduce risk of falls? Yes   ASSISTIVE DEVICES UTILIZED TO PREVENT FALLS:  Life alert? No  Use of a cane, walker or w/c? No  Grab bars in the bathroom? No  Shower chair or bench in shower? No  Elevated toilet seat or a handicapped toilet? No   TIMED UP AND GO:  Was the test performed? No .  Cognitive Function:   Normal cognitive status assessed by direct observation by this Nurse Health Advisor. No abnormalities found.        Immunizations Immunization History  Administered Date(s) Administered   Influenza,inj,quad, With Preservative 08/24/2019   Influenza-Unspecified 10/03/2014, 09/04/2015, 08/17/2018   Td 11/04/2007   Tdap 09/24/2018   Zoster Recombinat (Shingrix) 09/24/2018, 12/06/2018    TDAP status: Up to date  Flu Vaccine status: Up to date  Pneumococcal vaccine status: Due, Education has been provided regarding the importance of this vaccine. Advised may receive this vaccine at local pharmacy or Health Dept. Aware to provide a copy of the vaccination record if obtained from local pharmacy or Health Dept. Verbalized acceptance and understanding.  Covid-19 vaccine status: Information provided on how to obtain vaccines.   Qualifies for Shingles Vaccine? No   Zostavax completed No   Shingrix Completed?: Yes  Screening Tests Health Maintenance  Topic Date Due   INFLUENZA VACCINE  06/03/2021   PNA vac Low Risk Adult (1 of 2 - PCV13) 10/01/2021 (Originally 06/09/2020)   MAMMOGRAM  03/09/2022   DEXA SCAN  01/07/2024   COLONOSCOPY (Pts 45-64yr Insurance coverage will need to be confirmed)   03/20/2026   TETANUS/TDAP  09/24/2028   Hepatitis C Screening  Completed   Zoster Vaccines- Shingrix  Completed   HPV VACCINES  Aged Out   COVID-19 Vaccine  Discontinued    Health Maintenance  Health Maintenance Due  Topic Date Due   INFLUENZA VACCINE  06/03/2021    Colorectal cancer screening: Type of screening: Colonoscopy. Completed 2022. Repeat every 10 years  Mammogram status: Ordered  . Pt provided with contact info and advised to call to schedule appt.   Bone Density status: Ordered  . Pt provided with contact info and advised to call to schedule appt.  Lung Cancer Screening: (Low Dose CT Chest recommended if Age 66-80years, 30 pack-year currently smoking OR have quit w/in 15years.) does not qualify.   Lung Cancer Screening Referral:   Additional Screening:  Hepatitis C Screening: does not qualify; Completed   Vision Screening: Recommended annual ophthalmology exams for early detection of glaucoma and other disorders of the eye. Is the patient up to date with their annual eye exam?  No  Who is the provider or what is the name of the office in which the patient attends annual eye exams?  If pt is not established with a provider, would they like to be referred to a provider to establish care? No .   Dental Screening: Recommended annual dental exams for proper oral hygiene  Community Resource Referral / Chronic Care Management: CRR required this visit?  No   CCM required this visit?  No      Plan:     I have personally reviewed and noted the following in the patient's chart:   Medical and social history Use of alcohol, tobacco or illicit drugs  Current medications and supplements including opioid prescriptions. Patient is not currently taking opioid prescriptions. Functional ability and status Nutritional status Physical activity Advanced directives List of other physicians Hospitalizations, surgeries, and ER visits in previous 12  months Vitals Screenings to include cognitive, depression, and falls Referrals and appointments  In addition, I have reviewed and discussed with patient certain preventive protocols, quality metrics, and best practice recommendations. A written personalized care plan for preventive services as well as general preventive health recommendations were provided to patient.     JLeroy Kennedy LPN   [id number]  Nurse  Notes: na

## 2021-07-03 NOTE — Patient Instructions (Signed)
Gloria [name] , Thank you for taking time to come for your Medicare Wellness Visit. I appreciate your ongoing commitment to your health goals. Please review the following plan we discussed and let me know if I can assist you in the future.   Screening recommendations/referrals: Colonoscopy: up to date Mammogram: Education provided Bone Density: Education provided Recommended yearly ophthalmology/optometry visit for glaucoma screening and checkup Recommended yearly dental visit for hygiene and checkup  Vaccinations: Influenza vaccine: up to date Pneumococcal vaccine: Education provided Tdap vaccine: up to date Shingles vaccine: up to date    Advanced directives: Education provided  Conditions/risks identified:   Next appointment: 09-30-2021 @ 8:00 Dr. Raoul Pitch   Preventive Care 27 Years and Older, Female Preventive care refers to lifestyle choices and visits with your health care provider that can promote health and wellness. What does preventive care include? A yearly physical exam. This is also called an annual well check. Dental exams once or twice a year. Routine eye exams. Ask your health care provider how often you should have your eyes checked. Personal lifestyle choices, including: Daily care of your teeth and gums. Regular physical activity. Eating a healthy diet. Avoiding tobacco and drug use. Limiting alcohol use. Practicing safe sex. Taking low-dose aspirin every day. Taking vitamin and mineral supplements as recommended by your health care provider. What happens during an annual well check? The services and screenings done by your health care provider during your annual well check will depend on your age, overall health, lifestyle risk factors, and family history of disease. Counseling  Your health care provider may ask you questions about your: Alcohol use. Tobacco use. Drug use. Emotional well-being. Home and relationship well-being. Sexual activity. Eating  habits. History of falls. Memory and ability to understand (cognition). Work and work Statistician. Reproductive health. Screening  You may have the following tests or measurements: Height, weight, and BMI. Blood pressure. Lipid and cholesterol levels. These may be checked every 5 years, or more frequently if you are over 33 years old. Skin check. Lung cancer screening. You may have this screening every year starting at age 68 if you have a 30-pack-year history of smoking and currently smoke or have quit within the past 15 years. Fecal occult blood test (FOBT) of the stool. You may have this test every year starting at age 27. Flexible sigmoidoscopy or colonoscopy. You may have a sigmoidoscopy every 5 years or a colonoscopy every 10 years starting at age 43. Hepatitis C blood test. Hepatitis B blood test. Sexually transmitted disease (STD) testing. Diabetes screening. This is done by checking your blood sugar (glucose) after you have not eaten for a while (fasting). You may have this done every 1-3 years. Bone density scan. This is done to screen for osteoporosis. You may have this done starting at age 19. Mammogram. This may be done every 1-2 years. Talk to your health care provider about how often you should have regular mammograms. Talk with your health care provider about your test results, treatment options, and if necessary, the need for more tests. Vaccines  Your health care provider may recommend certain vaccines, such as: Influenza vaccine. This is recommended every year. Tetanus, diphtheria, and acellular pertussis (Tdap, Td) vaccine. You may need a Td booster every 10 years. Zoster vaccine. You may need this after age 7. Pneumococcal 13-valent conjugate (PCV13) vaccine. One dose is recommended after age 38. Pneumococcal polysaccharide (PPSV23) vaccine. One dose is recommended after age 76. Talk to your health care provider about  which screenings and vaccines you need and how  often you need them. This information is not intended to replace advice given to you by your health care provider. Make sure you discuss any questions you have with your health care provider. Document Released: 11/16/2015 Document Revised: 07/09/2016 Document Reviewed: 08/21/2015 Elsevier Interactive Patient Education  2017 [place] Prevention in the Home Falls can cause injuries. They can happen to people of all ages. There are many things you can do to make your home safe and to help prevent falls. What can I do on the outside of my home? Regularly fix the edges of walkways and driveways and fix any cracks. Remove anything that might make you trip as you walk through a door, such as a raised step or threshold. Trim any bushes or trees on the path to your home. Use bright outdoor lighting. Clear any walking paths of anything that might make someone trip, such as rocks or tools. Regularly check to see if handrails are loose or broken. Make sure that both sides of any steps have handrails. Any raised decks and porches should have guardrails on the edges. Have any leaves, snow, or ice cleared regularly. Use sand or salt on walking paths during winter. Clean up any spills in your garage right away. This includes oil or grease spills. What can I do in the bathroom? Use night lights. Install grab bars by the toilet and in the tub and shower. Do not use towel bars as grab bars. Use non-skid mats or decals in the tub or shower. If you need to sit down in the shower, use a plastic, non-slip stool. Keep the floor dry. Clean up any water that spills on the floor as soon as it happens. Remove soap buildup in the tub or shower regularly. Attach bath mats securely with double-sided non-slip rug tape. Do not have throw rugs and other things on the floor that can make you trip. What can I do in the bedroom? Use night lights. Make sure that you have a light by your bed that is easy to  reach. Do not use any sheets or blankets that are too big for your bed. They should not hang down onto the floor. Have a firm chair that has side arms. You can use this for support while you get dressed. Do not have throw rugs and other things on the floor that can make you trip. What can I do in the kitchen? Clean up any spills right away. Avoid walking on wet floors. Keep items that you use a lot in easy-to-reach places. If you need to reach something above you, use a strong step stool that has a grab bar. Keep electrical cords out of the way. Do not use floor polish or wax that makes floors slippery. If you must use wax, use non-skid floor wax. Do not have throw rugs and other things on the floor that can make you trip. What can I do with my stairs? Do not leave any items on the stairs. Make sure that there are handrails on both sides of the stairs and use them. Fix handrails that are broken or loose. Make sure that handrails are as long as the stairways. Check any carpeting to make sure that it is firmly attached to the stairs. Fix any carpet that is loose or worn. Avoid having throw rugs at the top or bottom of the stairs. If you do have throw rugs, attach them to the floor with  carpet tape. Make sure that you have a light switch at the top of the stairs and the bottom of the stairs. If you do not have them, ask someone to add them for you. What else can I do to help prevent falls? Wear shoes that: Do not have high heels. Have rubber bottoms. Are comfortable and fit you well. Are closed at the toe. Do not wear sandals. If you use a stepladder: Make sure that it is fully opened. Do not climb a closed stepladder. Make sure that both sides of the stepladder are locked into place. Ask someone to hold it for you, if possible. Clearly mark and make sure that you can see: Any grab bars or handrails. First and last steps. Where the edge of each step is. Use tools that help you move  around (mobility aids) if they are needed. These include: Canes. Walkers. Scooters. Crutches. Turn on the lights when you go into a dark area. Replace any light bulbs as soon as they burn out. Set up your furniture so you have a clear path. Avoid moving your furniture around. If any of your floors are uneven, fix them. If there are any pets around you, be aware of where they are. Review your medicines with your doctor. Some medicines can make you feel dizzy. This can increase your chance of falling. Ask your doctor what other things that you can do to help prevent falls. This information is not intended to replace advice given to you by your health care provider. Make sure you discuss any questions you have with your health care provider. Document Released: 08/16/2009 Document Revised: 03/27/2016 Document Reviewed: 11/24/2014 Elsevier Interactive Patient Education  2017 [employer].

## 2021-07-17 ENCOUNTER — Other Ambulatory Visit: Payer: Self-pay | Admitting: Family Medicine

## 2021-07-17 DIAGNOSIS — Z1231 Encounter for screening mammogram for malignant neoplasm of breast: Secondary | ICD-10-CM

## 2021-07-17 DIAGNOSIS — Z780 Asymptomatic menopausal state: Secondary | ICD-10-CM

## 2021-09-13 ENCOUNTER — Other Ambulatory Visit: Payer: Self-pay

## 2021-09-13 ENCOUNTER — Ambulatory Visit
Admission: RE | Admit: 2021-09-13 | Discharge: 2021-09-13 | Disposition: A | Discharge status: Home / Self Care | Payer: Medicare HMO | Source: Ambulatory Visit | Attending: Family Medicine | Admitting: Family Medicine

## 2021-09-13 DIAGNOSIS — Z1231 Encounter for screening mammogram for malignant neoplasm of breast: Secondary | ICD-10-CM

## 2021-09-16 ENCOUNTER — Telehealth: Payer: Self-pay

## 2021-09-16 NOTE — Telephone Encounter (Signed)
Please see other encounter.

## 2021-09-16 NOTE — Telephone Encounter (Signed)
Patient returning call from this number --- no voicemail - but our number come up on her missed calls

## 2021-09-30 ENCOUNTER — Ambulatory Visit (INDEPENDENT_AMBULATORY_CARE_PROVIDER_SITE_OTHER): Payer: Medicare HMO | Admitting: Family Medicine

## 2021-09-30 ENCOUNTER — Encounter: Payer: Self-pay | Admitting: Family Medicine

## 2021-09-30 ENCOUNTER — Other Ambulatory Visit: Payer: Self-pay

## 2021-09-30 VITALS — BP 137/73 | HR 57 | Temp 98.6°F | Ht <= 58 in | Wt 147.0 lb

## 2021-09-30 DIAGNOSIS — I10 Essential (primary) hypertension: Secondary | ICD-10-CM

## 2021-09-30 DIAGNOSIS — E669 Obesity, unspecified: Secondary | ICD-10-CM

## 2021-09-30 DIAGNOSIS — R7303 Prediabetes: Secondary | ICD-10-CM

## 2021-09-30 DIAGNOSIS — Z1231 Encounter for screening mammogram for malignant neoplasm of breast: Secondary | ICD-10-CM

## 2021-09-30 DIAGNOSIS — E785 Hyperlipidemia, unspecified: Secondary | ICD-10-CM | POA: Diagnosis not present

## 2021-09-30 DIAGNOSIS — Z0000 Encounter for general adult medical examination without abnormal findings: Secondary | ICD-10-CM

## 2021-09-30 DIAGNOSIS — M8589 Other specified disorders of bone density and structure, multiple sites: Secondary | ICD-10-CM

## 2021-09-30 DIAGNOSIS — E559 Vitamin D deficiency, unspecified: Secondary | ICD-10-CM

## 2021-09-30 LAB — LIPID PANEL
Cholesterol: 268 mg/dL — ABNORMAL HIGH (ref 0–200)
HDL: 53.1 mg/dL (ref >39.00)
NonHDL: 215.25
Total CHOL/HDL Ratio: 5
Triglycerides: 226 mg/dL — ABNORMAL HIGH (ref 0.0–149.0)
VLDL: 45.2 mg/dL — ABNORMAL HIGH (ref 0.0–40.0)

## 2021-09-30 LAB — HEMOGLOBIN A1C: Hgb A1c MFr Bld: 6.2 % (ref 4.6–6.5)

## 2021-09-30 LAB — CBC
HCT: 40.1 % (ref 36.0–46.0)
Hemoglobin: 13 g/dL (ref 12.0–15.0)
MCHC: 32.5 g/dL (ref 30.0–36.0)
MCV: 89.8 fl (ref 78.0–100.0)
Platelets: 254 10*3/uL (ref 150.0–400.0)
RBC: 4.47 Mil/uL (ref 3.87–5.11)
RDW: 12.8 % (ref 11.5–15.5)
WBC: 5.3 10*3/uL (ref 4.0–10.5)

## 2021-09-30 LAB — COMPREHENSIVE METABOLIC PANEL
ALT: 15 U/L (ref 0–35)
AST: 16 U/L (ref 0–37)
Albumin: 4.7 g/dL (ref 3.5–5.2)
Alkaline Phosphatase: 66 U/L (ref 39–117)
BUN: 16 mg/dL (ref 6–23)
CO2: 30 mEq/L (ref 19–32)
Calcium: 9.8 mg/dL (ref 8.4–10.5)
Chloride: 99 mEq/L (ref 96–112)
Creatinine, Ser: 0.66 mg/dL (ref 0.40–1.20)
GFR: 91.48 mL/min (ref >60.00)
Glucose, Bld: 95 mg/dL (ref 70–99)
Potassium: 4.8 mEq/L (ref 3.5–5.1)
Sodium: 137 mEq/L (ref 135–145)
Total Bilirubin: 0.6 mg/dL (ref 0.2–1.2)
Total Protein: 7.4 g/dL (ref 6.0–8.3)

## 2021-09-30 LAB — TSH: TSH: 1.28 u[IU]/mL (ref 0.35–5.50)

## 2021-09-30 LAB — VITAMIN D 25 HYDROXY (VIT D DEFICIENCY, FRACTURES): VITD: 51.73 ng/mL (ref 30.00–100.00)

## 2021-09-30 LAB — LDL CHOLESTEROL, DIRECT: Direct LDL: 171 mg/dL

## 2021-09-30 MED ORDER — ATORVASTATIN CALCIUM 40 MG PO TABS: 40.0000 mg | ORAL_TABLET | Freq: Every day | ORAL | 3 refills | Status: DC

## 2021-09-30 MED ORDER — LISINOPRIL-HYDROCHLOROTHIAZIDE 20-12.5 MG PO TABS: 1.0000 | ORAL_TABLET | Freq: Every day | ORAL | 1 refills | Status: DC

## 2021-09-30 NOTE — Progress Notes (Signed)
This visit occurred during the SARS-CoV-2 public health emergency.  Safety protocols were in place, including screening questions prior to the visit, additional usage of staff PPE, and extensive cleaning of exam room while observing appropriate contact time as indicated for disinfecting solutions.    Patient ID: Gloria [name], female  DOB: 10-03-1956, 65 y.o.   MRN: [id number] Patient Care Team    Relationship Specialty Notifications Start End  Gloria Hillock, DO PCP - General Family Medicine  08/20/15   Gloria Bruins, MD Consulting Physician Obstetrics and Gynecology  09/24/18     Chief Complaint  Patient presents with   Annual Exam    Subjective: Gloria [name] is a 65 y.o.  Female  present for CPE/cmc All past medical history, surgical history, allergies, family history, immunizations, medications and social history were updated in the electronic medical record today. All recent labs, ED visits and hospitalizations within the last year were reviewed.  Health maintenance:  Colonoscopy: no fhx. + cologuard 11/2020> colonoscopy at [facility]- 5 yr f/u per pt> requested Mammogram: no fhx, completed: 09/2021 Normal --  Breast center> ordered for 2023 Cervical cancer screening: l> 65 Immunizations: tdap UTD11/2019, Influenza declined today-she will get at work (encouraged yearly), Shingrix series completed 2020, declines covid vaccine. prevnar20 declined today Infectious disease screening: HIV and  Hep C completed DEXA: 01/07/2019, rpt 5 yr Assistive device: no Oxygen use:no Patient has a Dental home. Hospitalizations/ED visits: reviewed  Hypertension/HLD/obesity:  Pt reports  complaince with lisinopril -HCTZ 10-12.5 mg daily. Patient denies chest pain, shortness of breath, dizziness or lower extremity edema.  Pt takes a daily baby ASA.  She is tolerating statin.  Diet: Tries to follow a low-salt, low saturated, low sugar diet. Exercise: Does not exercise  routinely RF: Hypertension, hyperlipidemia, obesity, history of heart disease in her father.   Diabetes: She has tried modifying her diet, cutting back on sweets  Depression screen [facility] 2/9 09/30/2021 07/03/2021 10/01/2020 09/26/2019 03/11/2019  Decreased Interest 0 0 0 0 0  Down, Depressed, Hopeless 0 0 0 0 0  PHQ - 2 Score 0 0 0 0 0   No flowsheet data found.  Immunization History  Administered Date(s) Administered   Influenza,inj,quad, With Preservative 08/24/2019   Influenza-Unspecified 10/03/2014, 09/04/2015, 08/17/2018   Td 11/04/2007   Tdap 09/24/2018   Zoster Recombinat (Shingrix) 09/24/2018, 12/06/2018    Past Medical History:  Diagnosis Date   Allergy    High cholesterol    Hypertension    Nipple dermatitis 01/18/2016   Positive colorectal cancer screening using Cologuard test 11/15/2020   colonoscopy after at digestive health- 5 yr f/u   Seasonal allergies    No Known Allergies Past Surgical History:  Procedure Laterality Date   INNER EAR SURGERY     NO PAST SURGERIES     Family History  Problem Relation Age of Onset   Heart disease Father    Alcohol abuse Brother    Kidney disease Brother    Breast cancer Neg Hx    Social History   Social History Narrative   Gloria [name] is married.  She lives with husband & adopted daughter. She works FT at [employer].   Smoke detector in the home, wears her seatbelt.     Allergies as of 09/30/2021   No Known Allergies      Medication List        Accurate as of September 30, 2021  8:32 AM. If you have any questions, ask  your nurse or doctor.          aspirin 81 MG chewable tablet Chew by mouth daily. One tablet every other day   atorvastatin 40 MG tablet Commonly known as: LIPITOR Take 1 tablet (40 mg total) by mouth daily.   cholecalciferol 25 MCG (1000 UNIT) tablet Commonly known as: VITAMIN D3 Take 1,000 Units by mouth daily.   lisinopril-hydrochlorothiazide 20-12.5 MG  tablet Commonly known as: ZESTORETIC Take 1 tablet by mouth daily.        All past medical history, surgical history, allergies, family history, immunizations andmedications were updated in the EMR today and reviewed under the history and medication portions of their EMR.     No results found for this or any previous visit (from the past 2160 hour(s)).  MM 3D SCREEN BREAST BILATERAL Result Date: 09/13/2021 RECOMMENDATION: Screening mammogram in one year. (Code:[id number]) BI-RADS CATEGORY  1: Negative. Electronically Signed   By: Gloria [name] M.D.   On: 09/13/2021 18:00    ROS: 14 pt review of systems performed and negative (unless mentioned in an HPI)  Objective: BP 137/73 (BP Location: Left Arm)   Pulse (!) 57   Temp 98.6 F (37 C) (Oral)   Ht 4\' 10"  (1.473 m)   Wt 147 lb (66.7 kg)   SpO2 100%   BMI 30.72 kg/m  Gen: Afebrile. No acute distress. Nontoxic in appearance, well-developed, well-nourished,  pleasant female.  HENT: AT. [place]. Bilateral TM visualized and normal in appearance, normal external auditory canal. MMM, no oral lesions, adequate dentition. Bilateral nares within normal limits. Throat without erythema, ulcerations or exudates. no Cough on exam, no hoarseness on exam. Eyes:Pupils Equal Round Reactive to light, Extraocular movements intact,  Conjunctiva without redness, discharge or icterus. Neck/lymp/endocrine: Supple,no lymphadenopathy, no thyromegaly CV: RRR no murmur, no edema, +2/4 P posterior tibialis pulses.  Chest: CTAB, no wheeze, rhonchi or crackles. normal Respiratory effort. good Air movement. Abd: Soft. flat. NTND. BS present. no Masses palpated. No hepatosplenomegaly. No rebound tenderness or guarding. Skin: no rashes, purpura or petechiae. Warm and well-perfused. Skin intact. Neuro/Msk:  Normal gait. PERLA. EOMi. Alert. Oriented x3.  Cranial nerves II through XII intact. Muscle strength 5/5 upper/lower extremity. DTRs equal bilaterally. Psych:  Normal affect, dress and demeanor. Normal speech. Normal thought content and judgment.  No results found.  Assessment/plan: Gloria [name] is a 65 y.o. female present for CPE/CMC Essential hypertension/HLD/obesity Stable.  Low sodium, heart healthy diet.  Continue lisinopril-hctz - CBC - Comprehensive metabolic panel - Lipid panel - TSH F/u 5.5 mos  Prediabetes - Hemoglobin A1c Vitamin D deficiency/Osteopenia of multiple sites - VITAMIN D 25 Hydroxy (Vit-D Deficiency, Fractures) - dexa UTD Breast cancer screening by mammogram - MM 3D SCREEN BREAST BILATERAL; Future  Encounter for preventive health examination Colonoscopy: no fhx. + cologuard 11/2020> colonoscopy at [facility] specialist- 5 yr f/u per pt> requested Mammogram: no fhx, completed: 09/2021 Normal --  Breast center> ordered for 2023 Cervical cancer screening: l> 65 Immunizations: tdap UTD11/2019, Influenza declined today-she will get at work (encouraged yearly), Shingrix series completed 2020, declines covid vaccine. prevnar20 declined today Infectious disease screening: HIV and  Hep C completed DEXA: 01/07/2019, rpt 5 yr Patient was encouraged to exercise greater than 150 minutes a week. Patient was encouraged to choose a diet filled with fresh fruits and vegetables, and lean meats. AVS provided to patient today for education/recommendation on gender specific health and safety maintenance.  Return in about 24 weeks (around 03/17/2022) for [facility] (  30 min).  Orders Placed This Encounter  Procedures   MM 3D SCREEN BREAST BILATERAL   CBC   Comprehensive metabolic panel   Hemoglobin A1c   Lipid panel   TSH   VITAMIN D 25 Hydroxy (Vit-D Deficiency, Fractures)   Meds ordered this encounter  Medications   lisinopril-hydrochlorothiazide (ZESTORETIC) 20-12.5 MG tablet    Sig: Take 1 tablet by mouth daily.    Dispense:  90 tablet    Refill:  1   atorvastatin (LIPITOR) 40 MG tablet    Sig: Take 1 tablet (40 mg total)  by mouth daily.    Dispense:  90 tablet    Refill:  3   Referral Orders  No referral(s) requested today     Electronically signed by: Howard Pouch, [place]

## 2021-09-30 NOTE — Patient Instructions (Signed)
Health Maintenance After Age 65 After age 65, you are at a higher risk for certain long-term diseases and infections as well as injuries from falls. Falls are a major cause of broken bones and head injuries in people who are older than age 65. Getting regular preventive care can help to keep you healthy and well. Preventive care includes getting regular testing and making lifestyle changes as recommended by your health care provider. Talk with your health care provider about: Which screenings and tests you should have. A screening is a test that checks for a disease when you have no symptoms. A diet and exercise plan that is right for you. What should I know about screenings and tests to prevent falls? Screening and testing are the best ways to find a health problem early. Early diagnosis and treatment give you the best chance of managing medical conditions that are common after age 65. Certain conditions and lifestyle choices may make you more likely to have a fall. Your health care provider may recommend: Regular vision checks. Poor vision and conditions such as cataracts can make you more likely to have a fall. If you wear glasses, make sure to get your prescription updated if your vision changes. Medicine review. Work with your health care provider to regularly review all of the medicines you are taking, including over-the-counter medicines. Ask your health care provider about any side effects that may make you more likely to have a fall. Tell your health care provider if any medicines that you take make you feel dizzy or sleepy. Strength and balance checks. Your health care provider may recommend certain tests to check your strength and balance while standing, walking, or changing positions. Foot health exam. Foot pain and numbness, as well as not wearing proper footwear, can make you more likely to have a fall. Screenings, including: Osteoporosis screening. Osteoporosis is a condition that causes  the bones to get weaker and break more easily. Blood pressure screening. Blood pressure changes and medicines to control blood pressure can make you feel dizzy. Depression screening. You may be more likely to have a fall if you have a fear of falling, feel depressed, or feel unable to do activities that you used to do. Alcohol use screening. Using too much alcohol can affect your balance and may make you more likely to have a fall. Follow these instructions at home: Lifestyle Do not drink alcohol if: Your health care provider tells you not to drink. If you drink alcohol: Limit how much you have to: 0-1 drink a day for women. 0-2 drinks a day for men. Know how much alcohol is in your drink. In the [place] one drink equals one 12 oz bottle of beer (355 mL), one 5 oz glass of wine (148 mL), or one 1 oz glass of hard liquor (44 mL). Do not use any products that contain nicotine or tobacco. These products include cigarettes, chewing tobacco, and vaping devices, such as e-cigarettes. If you need help quitting, ask your health care provider. Activity  Follow a regular exercise program to stay fit. This will help you maintain your balance. Ask your health care provider what types of exercise are appropriate for you. If you need a cane or walker, use it as recommended by your health care provider. Wear supportive shoes that have nonskid soles. Safety  Remove any tripping hazards, such as rugs, cords, and clutter. Install safety equipment such as grab bars in bathrooms and safety rails on stairs. Keep rooms and walkways   well-lit. General instructions Talk with your health care provider about your risks for falling. Tell your health care provider if: You fall. Be sure to tell your health care provider about all falls, even ones that seem minor. You feel dizzy, tiredness (fatigue), or off-balance. Take over-the-counter and prescription medicines only as told by your health care provider. These include  supplements. Eat a healthy diet and maintain a healthy weight. A healthy diet includes low-fat dairy products, low-fat (lean) meats, and fiber from whole grains, beans, and lots of fruits and vegetables. Stay current with your vaccines. Schedule regular health, dental, and eye exams. Summary Having a healthy lifestyle and getting preventive care can help to protect your health and wellness after age 65. Screening and testing are the best way to find a health problem early and help you avoid having a fall. Early diagnosis and treatment give you the best chance for managing medical conditions that are more common for people who are older than age 65. Falls are a major cause of broken bones and head injuries in people who are older than age 65. Take precautions to prevent a fall at home. Work with your health care provider to learn what changes you can make to improve your health and wellness and to prevent falls. This information is not intended to replace advice given to you by your health care provider. Make sure you discuss any questions you have with your health care provider. Document Revised: 03/11/2021 Document Reviewed: 03/11/2021 Elsevier Patient Education  2022 [employer]  

## 2022-03-18 ENCOUNTER — Ambulatory Visit: Payer: Medicare HMO | Admitting: Family Medicine

## 2022-04-07 ENCOUNTER — Ambulatory Visit (INDEPENDENT_AMBULATORY_CARE_PROVIDER_SITE_OTHER): Payer: PPO | Admitting: Family Medicine

## 2022-04-07 ENCOUNTER — Encounter: Payer: Self-pay | Admitting: Family Medicine

## 2022-04-07 VITALS — BP 125/65 | HR 62 | Temp 97.5°F | Ht <= 58 in | Wt 151.0 lb

## 2022-04-07 DIAGNOSIS — E785 Hyperlipidemia, unspecified: Secondary | ICD-10-CM | POA: Diagnosis not present

## 2022-04-07 DIAGNOSIS — E559 Vitamin D deficiency, unspecified: Secondary | ICD-10-CM

## 2022-04-07 DIAGNOSIS — Z23 Encounter for immunization: Secondary | ICD-10-CM

## 2022-04-07 DIAGNOSIS — R7303 Prediabetes: Secondary | ICD-10-CM | POA: Diagnosis not present

## 2022-04-07 DIAGNOSIS — E669 Obesity, unspecified: Secondary | ICD-10-CM

## 2022-04-07 DIAGNOSIS — I10 Essential (primary) hypertension: Secondary | ICD-10-CM

## 2022-04-07 DIAGNOSIS — M8589 Other specified disorders of bone density and structure, multiple sites: Secondary | ICD-10-CM

## 2022-04-07 DIAGNOSIS — Z1231 Encounter for screening mammogram for malignant neoplasm of breast: Secondary | ICD-10-CM

## 2022-04-07 LAB — LIPID PANEL
Cholesterol: 260 mg/dL — ABNORMAL HIGH (ref 0–200)
HDL: 48.8 mg/dL (ref >39.00)
NonHDL: 211.44
Total CHOL/HDL Ratio: 5
Triglycerides: 246 mg/dL — ABNORMAL HIGH (ref 0.0–149.0)
VLDL: 49.2 mg/dL — ABNORMAL HIGH (ref 0.0–40.0)

## 2022-04-07 LAB — LDL CHOLESTEROL, DIRECT: Direct LDL: 173 mg/dL

## 2022-04-07 LAB — HEMOGLOBIN A1C: Hgb A1c MFr Bld: 6.1 % (ref 4.6–6.5)

## 2022-04-07 MED ORDER — ATORVASTATIN CALCIUM 40 MG PO TABS: 40.0000 mg | ORAL_TABLET | Freq: Every day | ORAL | 3 refills | Status: DC

## 2022-04-07 MED ORDER — LISINOPRIL-HYDROCHLOROTHIAZIDE 20-12.5 MG PO TABS: 1.0000 | ORAL_TABLET | Freq: Every day | ORAL | 1 refills | Status: DC

## 2022-04-07 NOTE — Progress Notes (Signed)
This visit occurred during the SARS-CoV-2 public health emergency.  Safety protocols were in place, including screening questions prior to the visit, additional usage of staff PPE, and extensive cleaning of exam room while observing appropriate contact time as indicated for disinfecting solutions.    Patient ID: Gloria [name], female  DOB: July 13, 1956, 66 y.o.   MRN: [id number] Patient Care Team    Relationship Specialty Notifications Start End  Gloria Hillock, DO PCP - General Family Medicine  08/20/15   Gloria Bruins, MD Consulting Physician Obstetrics and Gynecology  09/24/18   Specialists, Digestive Health  Gastroenterology  09/30/21     Chief Complaint  Patient presents with   Hypertension    [facility]; pt is fasting    Subjective: Gloria [name] is a 66 y.o.  Female  present for cmc All past medical history, surgical history, allergies, family history, immunizations, medications and social history were updated in the electronic medical record today. All recent labs, ED visits and hospitalizations within the last year were reviewed.  Hypertension/HLD/obesity:  Pt reports  compliance with lisinopril -HCTZ 20-12.5 mg daily. Patient denies chest pain, shortness of breath, dizziness or lower extremity edema.  Pt takes a daily baby ASA.  She is prescribed statin.  Diet: Tries to follow a low-salt, low saturated, low sugar diet. Exercise: Does not exercise routinely RF: Hypertension, hyperlipidemia, obesity, history of heart disease in her father.   Diabetes: She has modified her diet, she cut back on sweets.  She has lost weight.  Last A1c 6.2.       04/07/2022    9:07 AM 09/30/2021    8:21 AM 07/03/2021    9:00 AM 10/01/2020    9:27 AM 09/26/2019   12:54 PM  Depression screen PHQ 2/9  Decreased Interest 0 0 0 0 0  Down, Depressed, Hopeless 0 0 0 0 0  PHQ - 2 Score 0 0 0 0 0       View : No data to display.          Immunization History  Administered Date(s) Administered    Influenza,inj,quad, With Preservative 08/24/2019   Influenza-Unspecified 10/03/2014, 09/04/2015, 08/17/2018   Td 11/04/2007   Tdap 09/24/2018   Zoster Recombinat (Shingrix) 09/24/2018, 12/06/2018    Past Medical History:  Diagnosis Date   Allergy    High cholesterol    Hypertension    Nipple dermatitis 01/18/2016   Positive colorectal cancer screening using Cologuard test 11/15/2020   [facility]- 5 yr f/u   Seasonal allergies    No Known Allergies Past Surgical History:  Procedure Laterality Date   INNER EAR SURGERY     NO PAST SURGERIES     Family History  Problem Relation Age of Onset   Heart disease Father    Alcohol abuse Brother    Kidney disease Brother    Breast cancer Neg Hx    Social History   Social History Narrative   Gloria [name] is married.  She lives with husband & adopted daughter. She works FT at [employer].   Smoke detector in the home, wears her seatbelt.     Allergies as of 04/07/2022   No Known Allergies      Medication List        Accurate as of April 07, 2022  9:29 AM. If you have any questions, ask your nurse or doctor.          aspirin 81 MG chewable tablet Chew by  mouth daily. One tablet every other day   atorvastatin 40 MG tablet Commonly known as: LIPITOR Take 1 tablet (40 mg total) by mouth daily.   cholecalciferol 25 MCG (1000 UNIT) tablet Commonly known as: VITAMIN D3 Take 1,000 Units by mouth daily.   lisinopril-hydrochlorothiazide 20-12.5 MG tablet Commonly known as: ZESTORETIC Take 1 tablet by mouth daily.        All past medical history, surgical history, allergies, family history, immunizations andmedications were updated in the EMR today and reviewed under the history and medication portions of their EMR.     No results found for this or any previous visit (from the past 2160 hour(s)).  MM 3D SCREEN BREAST BILATERAL Result Date: 09/13/2021 RECOMMENDATION: Screening  mammogram in one year. (Code:SM-B-01Y) BI-RADS CATEGORY  1: Negative. Electronically Signed   By: Gloria [name] M.D.   On: 09/13/2021 18:00    ROS: 14 pt review of systems performed and negative (unless mentioned in an HPI)  Objective: BP 125/65   Pulse 62   Temp (!) 97.5 F (36.4 C) (Oral)   Ht '4\' 10"'$  (1.473 m)   Wt 151 lb (68.5 kg)   SpO2 98%   BMI 31.56 kg/m  Physical Exam Vitals and nursing note reviewed.  Constitutional:      General: She is not in acute distress.    Appearance: Normal appearance. She is not ill-appearing, toxic-appearing or diaphoretic.  HENT:     Head: Normocephalic and atraumatic.  Eyes:     General: No scleral icterus.       Right eye: No discharge.        Left eye: No discharge.     Extraocular Movements: Extraocular movements intact.     Conjunctiva/sclera: Conjunctivae normal.     Pupils: Pupils are equal, round, and reactive to light.  Cardiovascular:     Rate and Rhythm: Normal rate and regular rhythm.  Pulmonary:     Effort: Pulmonary effort is normal. No respiratory distress.     Breath sounds: Normal breath sounds. No wheezing, rhonchi or rales.  Musculoskeletal:     Cervical back: Neck supple. No tenderness.     Right lower leg: No edema.     Left lower leg: No edema.  Lymphadenopathy:     Cervical: No cervical adenopathy.  Skin:    General: Skin is warm and dry.     Coloration: Skin is not jaundiced or pale.     Findings: No erythema or rash.  Neurological:     Mental Status: She is alert and oriented to person, place, and time. Mental status is at baseline.     Motor: No weakness.     Gait: Gait normal.  Psychiatric:        Mood and Affect: Mood normal.        Behavior: Behavior normal.        Thought Content: Thought content normal.        Judgment: Judgment normal.    No results found.  Assessment/plan: Gloria [name] is a 66 y.o. female present for CPE/CMC Essential hypertension/HLD/obesity Stable.  Continue  lisinopril-hctz 20-12.'5mg'$  qd Low sodium, heart healthy diet.  Routine exercise Continue lisinopril-hctz Continue atorvastatin 40 mg nightly Lab UTD-the exception of rechecking her cholesterol today. F/u 5.5 mos  Prediabetes -Has been doing great watching her diet and losing weight. -Last A1c was 6.2. -A1c collected today.  Vitamin D deficiency/Osteopenia of multiple sites - lab UTD 09/2022-51 - dexa > due 2023-2025> has been ordered with  change in lx to [place] for her    Return in about 6 months (around 10/02/2022) for cpe (20 min), Routine chronic condition follow-up.  Orders Placed This Encounter  Procedures   MM 3D SCREEN BREAST BILATERAL   DG Bone Density   Hemoglobin A1c   Lipid panel   Meds ordered this encounter  Medications   lisinopril-hydrochlorothiazide (ZESTORETIC) 20-12.5 MG tablet    Sig: Take 1 tablet by mouth daily.    Dispense:  90 tablet    Refill:  1   atorvastatin (LIPITOR) 40 MG tablet    Sig: Take 1 tablet (40 mg total) by mouth daily.    Dispense:  90 tablet    Refill:  3   Referral Orders  No referral(s) requested today     Electronically signed by: Howard Pouch, [place]

## 2022-04-07 NOTE — Patient Instructions (Addendum)
Return in about 6 months (around 10/02/2022) for cpe (20 min), Routine chronic condition follow-up.  Mammogram and bone density due 11/12 at [facility].  [facility] is located at [address] [place], [place] [address]         Doristine Devoid to see you today.  I have refilled the medication(s) we provide.   If labs were collected, we will inform you of lab results once received either by echart message or telephone call.   - echart message- for normal results that have been seen by the patient already.   - telephone call: abnormal results or if patient has not viewed results in their echart.

## 2022-04-08 ENCOUNTER — Telehealth: Payer: Self-pay | Admitting: Family Medicine

## 2022-04-08 MED ORDER — ATORVASTATIN CALCIUM 80 MG PO TABS: 80.0000 mg | ORAL_TABLET | Freq: Every day | ORAL | 3 refills | Status: DC

## 2022-04-08 NOTE — Telephone Encounter (Signed)
Please call patient: Her cholesterol is still very high.  She stated she was taking the Lipitor 40 mg daily, therefore we need to increase the dose.  I called in the new dose today for her to start this evening.   Lipitor works best when taken in the evening.

## 2022-04-08 NOTE — Telephone Encounter (Signed)
LVM for pt to CB regarding results.  

## 2022-04-09 NOTE — Telephone Encounter (Signed)
LVM for pt to CB regarding results.  

## 2022-04-10 NOTE — Telephone Encounter (Signed)
LVM for pt to CB regarding results. Letter sent

## 2022-07-02 ENCOUNTER — Telehealth: Payer: Self-pay | Admitting: Family Medicine

## 2022-07-02 NOTE — Telephone Encounter (Signed)
Patient states she is returning our call.  I read Juliann Pulse Harris-Coley's note to patient.  Patient states she will call Juliann Pulse.

## 2022-07-02 NOTE — Telephone Encounter (Signed)
Left message for patient to schedule Annual Wellness Visit.  Please schedule (telephone/video call) with Nurse Health Advisor Tina Betterson, RN at [facility]. Please call [phone] ask for Kathy 

## 2022-07-08 ENCOUNTER — Telehealth: Payer: Self-pay | Admitting: Family Medicine

## 2022-07-08 NOTE — Telephone Encounter (Signed)
Left message for patient to schedule Annual Wellness Visit.  Please schedule (telephone/video call) with Nurse Health Advisor Tina Betterson, RN at [facility]. Please call [phone] ask for Kathy 

## 2022-07-18 ENCOUNTER — Ambulatory Visit (INDEPENDENT_AMBULATORY_CARE_PROVIDER_SITE_OTHER): Payer: PPO

## 2022-07-18 VITALS — BP 162/94 | HR 69 | Wt 149.6 lb

## 2022-07-18 DIAGNOSIS — Z0000 Encounter for general adult medical examination without abnormal findings: Secondary | ICD-10-CM | POA: Diagnosis not present

## 2022-07-18 NOTE — Patient Instructions (Signed)

## 2022-07-18 NOTE — Progress Notes (Signed)
Subjective:   Gloria [name] is a 66 y.o. female who presents for Medicare Annual (Subsequent) preventive examination.  Review of Systems    Defer to PCP Cardiac Risk Factors include: advanced age (>37mn, >>81women);hypertension     Objective:    Today's Vitals   07/18/22 1313  BP: (!) 162/94  Pulse: 69  SpO2: 97%  Weight: 149 lb 9 oz (67.8 kg)   Body mass index is 31.26 kg/m.     07/18/2022    1:11 PM 07/03/2021    8:49 AM  Advanced Directives  Does Patient Have a Medical Advance Directive? Yes No  Type of [place]   Does patient want to make changes to medical advance directive? No - Patient declined   Copy of [place] Chart? No - copy requested   Would patient like information on creating a medical advance directive?  No - Patient declined    Current Medications (verified) Outpatient Encounter Medications as of 07/18/2022  Medication Sig   aspirin 81 MG chewable tablet Chew by mouth daily. One tablet every other day   atorvastatin (LIPITOR) 80 MG tablet Take 1 tablet (80 mg total) by mouth daily.   cholecalciferol (VITAMIN D3) 25 MCG (1000 UT) tablet Take 1,000 Units by mouth daily.   lisinopril-hydrochlorothiazide (ZESTORETIC) 20-12.5 MG tablet Take 1 tablet by mouth daily.   No facility-administered encounter medications on file as of 07/18/2022.    Allergies (verified) Patient has no known allergies.   History: Past Medical History:  Diagnosis Date   Allergy    High cholesterol    Hypertension    Nipple dermatitis 01/18/2016   Positive colorectal cancer screening using Cologuard test 11/15/2020   colonoscopy after at [facility]- 5 yr f/u   Seasonal allergies    Past Surgical History:  Procedure Laterality Date   INNER EAR SURGERY     NO PAST SURGERIES     Family History  Problem Relation Age of Onset   Heart disease Father    Alcohol abuse Brother    Kidney disease  Brother    Breast cancer Neg Hx    Social History   Socioeconomic History   Marital status: Married    Spouse name: Not on file   Number of children: 1   Years of education: 4 yr [place]  Highest education level: Not on file  Occupational History   Occupation: CNA    Employer: [employer]    Comment: [employer]  Tobacco Use   Smoking status: Never   Smokeless tobacco: Never  Vaping Use   Vaping Use: Never used  Substance and Sexual Activity   Alcohol use: No    Alcohol/week: 0.0 standard drinks of alcohol   Drug use: No   Sexual activity: Yes    Birth control/protection: Post-menopausal  Other Topics Concern   Not on file  Social History Narrative   Gloria HClaxtonis married.  She lives with husband & adopted daughter. She works FT at [employer]   Smoke detector in the home, wears her seatbelt.    Social Determinants of Health   Financial Resource Strain: Low Risk  (07/18/2022)   Overall Financial Resource Strain (CARDIA)    Difficulty of Paying Living Expenses: Not hard at all  Food Insecurity: No Food Insecurity (07/18/2022)   Hunger Vital Sign    Worried About Running Out of Food in the Last Year: Never true  Ran Out of Food in the Last Year: Never true  Transportation Needs: No Transportation Needs (07/18/2022)   PRAPARE - Hydrologist (Medical): No    Lack of Transportation (Non-Medical): No  Physical Activity: Sufficiently Active (07/18/2022)   Exercise Vital Sign    Days of Exercise per Week: 5 days    Minutes of Exercise per Session: 30 min  Stress: No Stress Concern Present (07/18/2022)   [place]    Feeling of Stress : Not at all  Social Connections: Moderately Integrated (07/18/2022)   Social Connection and Isolation Panel [NHANES]    Frequency of Communication with Friends and Family: More than three times a week    Frequency of Social Gatherings  with Friends and Family: More than three times a week    Attends Religious Services: More than 4 times per year    Active Member of [employer] or Organizations: No    Attends Music therapist: Never    Marital Status: Married    Tobacco Counseling Counseling given: Not Answered   Clinical Intake:  Pre-visit preparation completed: No  Pain : No/denies pain     Nutritional Risks: None Diabetes: No  How often do you need to have someone help you when you read instructions, pamphlets, or other written materials from your doctor or pharmacy?: 1 - Never  Diabetic?no  Interpreter Needed?: No      Activities of Daily Living    07/18/2022    1:12 PM  In your present state of health, do you have any difficulty performing the following activities:  Hearing? 0  Vision? 0  Difficulty concentrating or making decisions? 0  Walking or climbing stairs? 0  Dressing or bathing? 0  Doing errands, shopping? 0  Preparing Food and eating ? N  Using the Toilet? N  In the past six months, have you accidently leaked urine? N  Do you have problems with loss of bowel control? N  Managing your Medications? N  Managing your Finances? N  Housekeeping or managing your Housekeeping? N    Patient Care Team: Ma Hillock, DO as PCP - General (Family Medicine) Princess Bruins, MD as Consulting Physician (Obstetrics and Gynecology) Specialists, Digestive Health (Gastroenterology)  Indicate any recent Medical Services you may have received from other than [facility] providers in the past year (date may be approximate).     Assessment:   This is a routine wellness examination for Gloria [name].  Hearing/Vision screen No results found.  Dietary issues and exercise activities discussed: Current Exercise Habits: Home exercise routine, Type of exercise: walking, Time (Minutes): 30, Frequency (Times/Week): 5, Weekly Exercise (Minutes/Week): 150, Intensity: Mild, Exercise limited by: None  identified   Goals Addressed   None   Depression Screen    07/18/2022    1:13 PM 04/07/2022    9:07 AM 09/30/2021    8:21 AM 07/03/2021    9:00 AM 10/01/2020    9:27 AM 09/26/2019   12:54 PM 03/11/2019   11:10 AM  PHQ 2/9 Scores  PHQ - 2 Score 0 0 0 0 0 0 0    Fall Risk    04/07/2022    9:07 AM 09/30/2021    8:21 AM 07/03/2021    8:50 AM 10/01/2020    9:27 AM 09/24/2018    8:16 AM  Fall Risk   Falls in the past year? 0 0 0 0 0  Number falls in past yr:  0 0 0 0   Injury with Fall? 0 0 0 0   Risk for fall due to : No Fall Risks      Follow up Falls evaluation completed Falls evaluation completed Falls evaluation completed;Falls prevention discussed Falls evaluation completed Falls evaluation completed    FALL RISK PREVENTION PERTAINING TO THE HOME:  Any stairs in or around the home? Yes  If so, are there any without handrails? Yes  Home free of loose throw rugs in walkways, pet beds, electrical cords, etc? Yes  Adequate lighting in your home to reduce risk of falls? Yes   ASSISTIVE DEVICES UTILIZED TO PREVENT FALLS:  Life alert? No  Use of a cane, walker or w/c? No  Grab bars in the bathroom? Yes  Shower chair or bench in shower? Yes  Elevated toilet seat or a handicapped toilet? No   TIMED UP AND GO:  Was the test performed? Yes .  Length of time to ambulate 10 feet: 9 sec.   Gait steady and fast without use of assistive device  Cognitive Function:        07/18/2022    1:13 PM  6CIT Screen  What Year? 0 points  What month? 0 points  What time? 0 points  Count back from 20 0 points  Months in reverse 0 points  Repeat phrase 0 points  Total Score 0 points    Immunizations Immunization History  Administered Date(s) Administered   Influenza,inj,quad, With Preservative 08/24/2019   Influenza-Unspecified 10/03/2014, 09/04/2015, 08/17/2018   Td 11/04/2007   Tdap 09/24/2018   Zoster Recombinat (Shingrix) 09/24/2018, 12/06/2018    TDAP status: Up to  date  Flu Vaccine status: Due, Education has been provided regarding the importance of this vaccine. Advised may receive this vaccine at local pharmacy or Health Dept. Aware to provide a copy of the vaccination record if obtained from local pharmacy or Health Dept. Verbalized acceptance and understanding.  Pneumococcal vaccine status: Due, Education has been provided regarding the importance of this vaccine. Advised may receive this vaccine at local pharmacy or Health Dept. Aware to provide a copy of the vaccination record if obtained from local pharmacy or Health Dept. Verbalized acceptance and understanding.  Covid-19 vaccine status: Declined, Education has been provided regarding the importance of this vaccine but patient still declined. Advised may receive this vaccine at local pharmacy or Health Dept.or vaccine clinic. Aware to provide a copy of the vaccination record if obtained from local pharmacy or Health Dept. Verbalized acceptance and understanding.  Qualifies for Shingles Vaccine? Yes   Zostavax completed No   Shingrix Completed?: Yes  Screening Tests Health Maintenance  Topic Date Due   INFLUENZA VACCINE  06/03/2022   Pneumonia Vaccine 50+ Years old (1 - PCV) 09/30/2022 (Originally 06/09/2020)   MAMMOGRAM  09/14/2023   DEXA SCAN  01/07/2024   COLONOSCOPY (Pts 45-28yr Insurance coverage will need to be confirmed)  03/20/2026   TETANUS/TDAP  09/24/2028   Hepatitis C Screening  Completed   Zoster Vaccines- Shingrix  Completed   HPV VACCINES  Aged Out   COVID-19 Vaccine  Discontinued   Fecal DNA (Cologuard)  Discontinued    Health Maintenance  Health Maintenance Due  Topic Date Due   INFLUENZA VACCINE  06/03/2022    Colorectal cancer screening: Type of screening: Colonoscopy. Completed 03/20/21. Repeat every 5 years  Mammogram status: Ordered 04/07/22. Pt provided with contact info and advised to call to schedule appt.   Bone Density status: Ordered 04/07/22. Pt  provided with contact info and advised to call to schedule appt.  Lung Cancer Screening: (Low Dose CT Chest recommended if Age 74-80 years, 30 pack-year currently smoking OR have quit w/in 15years.) does not qualify.   Lung Cancer Screening Referral: n/a  Additional Screening:  Hepatitis C Screening: does qualify; Completed 09/24/2018  Vision Screening: Recommended annual ophthalmology exams for early detection of glaucoma and other disorders of the eye. Is the patient up to date with their annual eye exam?  Yes  Who is the provider or what is the name of the office in which the patient attends annual eye exams? [facility] If pt is not established with a provider, would they like to be referred to a provider to establish care? No .   Dental Screening: Recommended annual dental exams for proper oral hygiene  Community Resource Referral / Chronic Care Management: CRR required this visit?  No   CCM required this visit?  No      Plan:     I have personally reviewed and noted the following in the patient's chart:   Medical and social history Use of alcohol, tobacco or illicit drugs  Current medications and supplements including opioid prescriptions. Patient is not currently taking opioid prescriptions. Functional ability and status Nutritional status Physical activity Advanced directives List of other physicians Hospitalizations, surgeries, and ER visits in previous 12 months Vitals Screenings to include cognitive, depression, and falls Referrals and appointments  In addition, I have reviewed and discussed with patient certain preventive protocols, quality metrics, and best practice recommendations. A written personalized care plan for preventive services as well as general preventive health recommendations were provided to patient.     Beatrix Fetters, [place]   07/18/2022   Nurse Notes: Non-Face to Face or Face to Face 15 minute visit Encounter   Gloria [name] , Thank you  for taking time to come for your Medicare Wellness Visit. I appreciate your ongoing commitment to your health goals. Please review the following plan we discussed and let me know if I can assist you in the future.   These are the goals we discussed:  Goals      Patient Stated     Continue current lifestyle        This is a list of the screening recommended for you and due dates:  Health Maintenance  Topic Date Due   Flu Shot  06/03/2022   Pneumonia Vaccine (1 - PCV) 09/30/2022*   Mammogram  09/14/2023   DEXA scan (bone density measurement)  01/07/2024   Colon Cancer Screening  03/20/2026   Tetanus Vaccine  09/24/2028   Hepatitis C Screening: USPSTF Recommendation to screen - Ages 18-79 yo.  Completed   Zoster (Shingles) Vaccine  Completed   HPV Vaccine  Aged Out   COVID-19 Vaccine  Discontinued   Cologuard (Stool DNA test)  Discontinued  *Topic was postponed. The date shown is not the original due date.

## 2022-09-22 ENCOUNTER — Encounter: Payer: Self-pay | Admitting: Family Medicine

## 2022-09-22 ENCOUNTER — Ambulatory Visit (INDEPENDENT_AMBULATORY_CARE_PROVIDER_SITE_OTHER): Payer: PPO | Admitting: Family Medicine

## 2022-09-22 VITALS — BP 182/92 | HR 59 | Temp 98.5°F | Wt 150.0 lb

## 2022-09-22 DIAGNOSIS — I10 Essential (primary) hypertension: Secondary | ICD-10-CM

## 2022-09-22 NOTE — Progress Notes (Signed)
Gloria [name] , 12/14/1955, 66 y.o., female MRN: [id number] Patient Care Team    Relationship Specialty Notifications Start End  Gloria Hillock, DO PCP - General Family Medicine  08/20/15   Gloria Bruins, MD Consulting Physician Obstetrics and Gynecology  09/24/18   Specialists, [facility]ive [facility]  Gastroenterology  09/30/21     Chief Complaint  Patient presents with   Hypertension    207/120; 198/82-7:35 meds taken at 6 am currently has headache; hit head this morning     Subjective: Pt presents for an OV with complaints of elevated BP at work today. She drove to our office as a walk-in. Due to a cancellation, we were able to accommodate her.  She is compliant with lisinopril 20-12.5 mg qd. She took her medication at 6:00 am today. She bent over and bumped her head in the middle of forehead, on a night stand and had had a headache since. She denies visual changes or dizziness. She went to work, but due to headache she checked her BP and it was 782 systolic and them rpt 956 systolic.  Patient denies chest pain, shortness of breath, dizziness or lower extremity edema.       07/18/2022    1:13 PM 04/07/2022    9:07 AM 09/30/2021    8:21 AM 07/03/2021    9:00 AM 10/01/2020    9:27 AM  Depression screen PHQ 2/9  Decreased Interest 0 0 0 0 0  Down, Depressed, Hopeless 0 0 0 0 0  PHQ - 2 Score 0 0 0 0 0    No Known Allergies Social History   Social History Narrative   Gloria [name] is married.  She lives with husband & adopted daughter. She works FT at [employer].   Smoke detector in the home, wears her seatbelt.    Past Medical History:  Diagnosis Date   Allergy    High cholesterol    Hypertension    Nipple dermatitis 01/18/2016   Positive colorectal cancer screening using Cologuard test 11/15/2020   colonoscopy after at [facility]- 5 yr f/u   Seasonal allergies    Past Surgical History:  Procedure Laterality Date   INNER EAR SURGERY     NO PAST  SURGERIES     Family History  Problem Relation Age of Onset   Heart disease Father    Alcohol abuse Brother    Kidney disease Brother    Breast cancer Neg Hx    Allergies as of 09/22/2022   No Known Allergies      Medication List        Accurate as of September 22, 2022  9:25 AM. If you have any questions, ask your nurse or doctor.          aspirin 81 MG chewable tablet Chew by mouth daily. One tablet every other day   atorvastatin 80 MG tablet Commonly known as: LIPITOR Take 1 tablet (80 mg total) by mouth daily.   cholecalciferol 25 MCG (1000 UNIT) tablet Commonly known as: VITAMIN D3 Take 1,000 Units by mouth daily.   lisinopril-hydrochlorothiazide 20-12.5 MG tablet Commonly known as: ZESTORETIC Take 1 tablet by mouth daily.        All past medical history, surgical history, allergies, family history, immunizations andmedications were updated in the EMR today and reviewed under the history and medication portions of their EMR.     ROS Negative, with the exception of above mentioned in HPI   Objective:  BP (!) 182/92   Pulse (!) 59   Temp 98.5 F (36.9 C)   Wt 150 lb (68 kg)   SpO2 100%   BMI 31.35 kg/m  Body mass index is 31.35 kg/m. Physical Exam Vitals and nursing note reviewed.  Constitutional:      General: She is not in acute distress.    Appearance: Normal appearance. She is not ill-appearing, toxic-appearing or diaphoretic.  HENT:     Head: Normocephalic and atraumatic.  Eyes:     General: No scleral icterus.       Right eye: No discharge.        Left eye: No discharge.     Extraocular Movements: Extraocular movements intact.     Conjunctiva/sclera: Conjunctivae normal.     Pupils: Pupils are equal, round, and reactive to light.  Cardiovascular:     Rate and Rhythm: Normal rate and regular rhythm.     Heart sounds: No murmur heard. Pulmonary:     Effort: Pulmonary effort is normal. No respiratory distress.     Breath sounds:  Normal breath sounds. No wheezing, rhonchi or rales.  Musculoskeletal:     Right lower leg: No edema.     Left lower leg: No edema.  Skin:    General: Skin is warm and dry.     Coloration: Skin is not jaundiced or pale.     Findings: No erythema or rash.  Neurological:     Mental Status: She is alert and oriented to person, place, and time. Mental status is at baseline.     Motor: No weakness.     Gait: Gait normal.  Psychiatric:        Mood and Affect: Mood normal.        Behavior: Behavior normal.        Thought Content: Thought content normal.        Judgment: Judgment normal.     No results found. No results found. No results found for this or any previous visit (from the past 24 hour(s)).  Assessment/Plan: Gloria [name] is a 66 y.o. female present for OV for  Essential hypertension Elevated today- but decreasing. Suspect her elevated BP is secondary to her headache.  Continue lisinopril 20-12.5 mg qd Monitor pressure Rest. Tx headache- pt declined Toradol inj.  Work excuse provided.  Emergent precautions discussed.  She has an appt scheduled in 1 week for [facility] Reviewed expectations re: course of current medical issues. Discussed self-management of symptoms. Outlined signs and symptoms indicating need for more acute intervention. Patient verbalized understanding and all questions were answered. Patient received an After-Visit Summary.    No orders of the defined types were placed in this encounter.  No orders of the defined types were placed in this encounter.  Referral Orders  No referral(s) requested today     Note is dictated utilizing voice recognition software. Although note has been proof read prior to signing, occasional typographical errors still can be missed. If any questions arise, please do not hesitate to call for verification.   electronically signed by:  Howard Pouch, DO  [place]

## 2022-09-22 NOTE — Patient Instructions (Addendum)
Return if symptoms worsen or fail to improve.        Great to see you today.  I have refilled the medication(s) we provide.   If labs were collected, we will inform you of lab results once received either by echart message or telephone call.   - echart message- for normal results that have been seen by the patient already.   - telephone call: abnormal results or if patient has not viewed results in their echart.  

## 2022-09-29 ENCOUNTER — Encounter: Payer: Self-pay | Admitting: Family Medicine

## 2022-09-29 ENCOUNTER — Ambulatory Visit (INDEPENDENT_AMBULATORY_CARE_PROVIDER_SITE_OTHER): Payer: PPO | Admitting: Family Medicine

## 2022-09-29 VITALS — BP 176/72 | HR 69 | Temp 98.2°F | Wt 150.8 lb

## 2022-09-29 DIAGNOSIS — E669 Obesity, unspecified: Secondary | ICD-10-CM | POA: Diagnosis not present

## 2022-09-29 DIAGNOSIS — I10 Essential (primary) hypertension: Secondary | ICD-10-CM | POA: Diagnosis not present

## 2022-09-29 DIAGNOSIS — Z0000 Encounter for general adult medical examination without abnormal findings: Secondary | ICD-10-CM

## 2022-09-29 DIAGNOSIS — M8589 Other specified disorders of bone density and structure, multiple sites: Secondary | ICD-10-CM

## 2022-09-29 DIAGNOSIS — E785 Hyperlipidemia, unspecified: Secondary | ICD-10-CM

## 2022-09-29 DIAGNOSIS — Z79899 Other long term (current) drug therapy: Secondary | ICD-10-CM | POA: Diagnosis not present

## 2022-09-29 DIAGNOSIS — E559 Vitamin D deficiency, unspecified: Secondary | ICD-10-CM

## 2022-09-29 DIAGNOSIS — R7303 Prediabetes: Secondary | ICD-10-CM

## 2022-09-29 LAB — COMPREHENSIVE METABOLIC PANEL
ALT: 16 U/L (ref 0–35)
AST: 18 U/L (ref 0–37)
Albumin: 4.8 g/dL (ref 3.5–5.2)
Alkaline Phosphatase: 71 U/L (ref 39–117)
BUN: 13 mg/dL (ref 6–23)
CO2: 31 mEq/L (ref 19–32)
Calcium: 9.8 mg/dL (ref 8.4–10.5)
Chloride: 94 mEq/L — ABNORMAL LOW (ref 96–112)
Creatinine, Ser: 0.63 mg/dL (ref 0.40–1.20)
GFR: 91.87 mL/min (ref >60.00)
Glucose, Bld: 87 mg/dL (ref 70–99)
Potassium: 4.5 mEq/L (ref 3.5–5.1)
Sodium: 135 mEq/L (ref 135–145)
Total Bilirubin: 0.8 mg/dL (ref 0.2–1.2)
Total Protein: 7.6 g/dL (ref 6.0–8.3)

## 2022-09-29 LAB — CBC
HCT: 41.1 % (ref 36.0–46.0)
Hemoglobin: 13.8 g/dL (ref 12.0–15.0)
MCHC: 33.5 g/dL (ref 30.0–36.0)
MCV: 88.4 fl (ref 78.0–100.0)
Platelets: 245 10*3/uL (ref 150.0–400.0)
RBC: 4.65 Mil/uL (ref 3.87–5.11)
RDW: 12.5 % (ref 11.5–15.5)
WBC: 7.6 10*3/uL (ref 4.0–10.5)

## 2022-09-29 LAB — VITAMIN D 25 HYDROXY (VIT D DEFICIENCY, FRACTURES): VITD: 53.73 ng/mL (ref 30.00–100.00)

## 2022-09-29 LAB — LIPID PANEL
Cholesterol: 210 mg/dL — ABNORMAL HIGH (ref 0–200)
HDL: 55.2 mg/dL (ref >39.00)
LDL Cholesterol: 122 mg/dL — ABNORMAL HIGH (ref 0–99)
NonHDL: 154.51
Total CHOL/HDL Ratio: 4
Triglycerides: 161 mg/dL — ABNORMAL HIGH (ref 0.0–149.0)
VLDL: 32.2 mg/dL (ref 0.0–40.0)

## 2022-09-29 LAB — HEMOGLOBIN A1C: Hgb A1c MFr Bld: 6.1 % (ref 4.6–6.5)

## 2022-09-29 LAB — TSH: TSH: 0.83 u[IU]/mL (ref 0.35–5.50)

## 2022-09-29 MED ORDER — ATORVASTATIN CALCIUM 80 MG PO TABS: 80.0000 mg | ORAL_TABLET | Freq: Every day | ORAL | 3 refills | Status: DC

## 2022-09-29 MED ORDER — LISINOPRIL-HYDROCHLOROTHIAZIDE 20-12.5 MG PO TABS: 1.5000 | ORAL_TABLET | Freq: Every day | ORAL | 1 refills | Status: DC

## 2022-09-29 NOTE — Patient Instructions (Addendum)
Return in about 2 weeks (around 10/13/2022) for hypertension follow up.        Great to see you today.  I have refilled the medication(s) we provide.   If labs were collected, we will inform you of lab results once received either by echart message or telephone call.   - echart message- for normal results that have been seen by the patient already.   - telephone call: abnormal results or if patient has not viewed results in their echart.  Health Maintenance After Age 53 After age 2, you are at a higher risk for certain long-term diseases and infections as well as injuries from falls. Falls are a major cause of broken bones and head injuries in people who are older than age 67. Getting regular preventive care can help to keep you healthy and well. Preventive care includes getting regular testing and making lifestyle changes as recommended by your health care provider. Talk with your health care provider about: Which screenings and tests you should have. A screening is a test that checks for a disease when you have no symptoms. A diet and exercise plan that is right for you. What should I know about screenings and tests to prevent falls? Screening and testing are the best ways to find a health problem early. Early diagnosis and treatment give you the best chance of managing medical conditions that are common after age 67. Certain conditions and lifestyle choices may make you more likely to have a fall. Your health care provider may recommend: Regular vision checks. Poor vision and conditions such as cataracts can make you more likely to have a fall. If you wear glasses, make sure to get your prescription updated if your vision changes. Medicine review. Work with your health care provider to regularly review all of the medicines you are taking, including over-the-counter medicines. Ask your health care provider about any side effects that may make you more likely to have a fall. Tell your health  care provider if any medicines that you take make you feel dizzy or sleepy. Strength and balance checks. Your health care provider may recommend certain tests to check your strength and balance while standing, walking, or changing positions. Foot health exam. Foot pain and numbness, as well as not wearing proper footwear, can make you more likely to have a fall. Screenings, including: Osteoporosis screening. Osteoporosis is a condition that causes the bones to get weaker and break more easily. Blood pressure screening. Blood pressure changes and medicines to control blood pressure can make you feel dizzy. Depression screening. You may be more likely to have a fall if you have a fear of falling, feel depressed, or feel unable to do activities that you used to do. Alcohol use screening. Using too much alcohol can affect your balance and may make you more likely to have a fall. Follow these instructions at home: Lifestyle Do not drink alcohol if: Your health care provider tells you not to drink. If you drink alcohol: Limit how much you have to: 0-1 drink a day for women. 0-2 drinks a day for men. Know how much alcohol is in your drink. In the [place] one drink equals one 12 oz bottle of beer (355 mL), one 5 oz glass of wine (148 mL), or one 1 oz glass of hard liquor (44 mL). Do not use any products that contain nicotine or tobacco. These products include cigarettes, chewing tobacco, and vaping devices, such as e-cigarettes. If you need help quitting, ask  your health care provider. Activity  Follow a regular exercise program to stay fit. This will help you maintain your balance. Ask your health care provider what types of exercise are appropriate for you. If you need a cane or walker, use it as recommended by your health care provider. Wear supportive shoes that have nonskid soles. Safety  Remove any tripping hazards, such as rugs, cords, and clutter. Install safety equipment such as grab bars  in bathrooms and safety rails on stairs. Keep rooms and walkways well-lit. General instructions Talk with your health care provider about your risks for falling. Tell your health care provider if: You fall. Be sure to tell your health care provider about all falls, even ones that seem minor. You feel dizzy, tiredness (fatigue), or off-balance. Take over-the-counter and prescription medicines only as told by your health care provider. These include supplements. Eat a healthy diet and maintain a healthy weight. A healthy diet includes low-fat dairy products, low-fat (lean) meats, and fiber from whole grains, beans, and lots of fruits and vegetables. Stay current with your vaccines. Schedule regular health, dental, and eye exams. Summary Having a healthy lifestyle and getting preventive care can help to protect your health and wellness after age 67. Screening and testing are the best way to find a health problem early and help you avoid having a fall. Early diagnosis and treatment give you the best chance for managing medical conditions that are more common for people who are older than age 67. Falls are a major cause of broken bones and head injuries in people who are older than age 67. Take precautions to prevent a fall at home. Work with your health care provider to learn what changes you can make to improve your health and wellness and to prevent falls. This information is not intended to replace advice given to you by your health care provider. Make sure you discuss any questions you have with your health care provider. Document Revised: 03/11/2021 Document Reviewed: 03/11/2021 Elsevier Patient Education  [place].

## 2022-09-29 NOTE — Progress Notes (Signed)
Patient ID: Gloria [name], female  DOB: 18-Sep-1956, 66 y.o.   MRN: [id number] Patient Care Team    Relationship Specialty Notifications Start End  Ma Hillock, DO PCP - General Family Medicine  08/20/15   Princess Bruins, MD Consulting Physician Obstetrics and Gynecology  09/24/18   Specialists, Digestive Health  Gastroenterology  09/30/21     Chief Complaint  Patient presents with   Annual Exam    cmc    Subjective: Gloria [name] is a 66 y.o.  Female  present for cmc/CPE All past medical history, surgical history, allergies, family history, immunizations, medications and social history were updated in the electronic medical record today. All recent labs, ED visits and hospitalizations within the last year were reviewed.  Health maintenance:  Colonoscopy: no fhx. + cologuard 11/2020> [facility]oscopy at [facility]- 5 yr f/u per pt>  Mammogram: no fhx, completed: 09/2021 > has been ordered- encouraged her to schedule --  [facility] center Cervical cancer screening: Not indicated> 65 Immunizations: tdap UTD11/2019, Influenza declined(encouraged yearly), Shingrix series completed,Prevnar20declined Infectious disease screening: HIV and  Hep C completed DEXA: 01/07/2019, rpt 5 yr Assistive device: no Oxygen use:no Patient has a Dental home. Hospitalizations/ED visits: reviewed  Hypertension/HLD/obesity:  Pt reports compliance with lisinopril -HCTZ 20-12.5 mg daily. Patient denies chest pain, shortness of breath, dizziness or lower extremity edema.   Pt takes a daily baby ASA.  She is prescribed statin.  Diet: Tries to follow a low-salt, low saturated, low sugar diet. Exercise: Does not exercise routinely RF: Hypertension, hyperlipidemia, obesity, history of heart disease in her father.   Diabetes: She has modified her diet, she cut back on sweets.  She has lost weight.       07/18/2022    1:13 PM 04/07/2022    9:07 AM 09/30/2021    8:21 AM 07/03/2021    9:00  AM 10/01/2020    9:27 AM  Depression screen PHQ 2/9  Decreased Interest 0 0 0 0 0  Down, Depressed, Hopeless 0 0 0 0 0  PHQ - 2 Score 0 0 0 0 0       No data to display          Immunization History  Administered Date(s) Administered   Influenza,inj,quad, With Preservative 08/24/2019   Influenza-Unspecified 10/03/2014, 09/04/2015, 08/17/2018   Td 11/04/2007   Tdap 09/24/2018   Zoster Recombinat (Shingrix) 09/24/2018, 12/06/2018    Past Medical History:  Diagnosis Date   Allergy    High cholesterol    Hypertension    Nipple dermatitis 01/18/2016   Positive colorectal cancer screening using Cologuard test 11/15/2020   colonoscopy after at [facility]- 5 yr f/u   Seasonal allergies    No Known Allergies Past Surgical History:  Procedure Laterality Date   INNER EAR SURGERY     NO PAST SURGERIES     Family History  Problem Relation Age of Onset   Heart disease Father    Alcohol abuse Brother    Kidney disease Brother    Breast cancer Neg Hx    Social History   Social History Narrative   Gloria [name] is married.  She lives with husband & adopted daughter. She works FT at [employer].   Smoke detector in the home, wears her seatbelt.     Allergies as of 09/29/2022   No Known Allergies      Medication List        Accurate as of September 29, 2022  1:56 PM. If you have any questions, ask your nurse or doctor.          aspirin 81 MG chewable tablet Chew by mouth daily. One tablet every other day   atorvastatin 80 MG tablet Commonly known as: LIPITOR Take 1 tablet (80 mg total) by mouth daily.   cholecalciferol 25 MCG (1000 UNIT) tablet Commonly known as: VITAMIN D3 Take 1,000 Units by mouth daily.   lisinopril-hydrochlorothiazide 20-12.5 MG tablet Commonly known as: ZESTORETIC Take 1.5 tablets by mouth daily. What changed: how much to take Changed by: Howard Pouch, DO        All past medical history, surgical history,  allergies, family history, immunizations andmedications were updated in the EMR today and reviewed under the history and medication portions of their EMR.     No results found for this or any previous visit (from the past 2160 hour(s)).  MM 3D SCREEN BREAST BILATERAL Result Date: 09/13/2021 RECOMMENDATION: Screening mammogram in one year. (Code:SM-B-01Y) BI-RADS CATEGORY  1: Negative. Electronically Signed   By: Fidela Salisbury M.D.   On: 09/13/2021 18:00    ROS: 14 pt review of systems performed and negative (unless mentioned in an HPI)  Objective: BP (!) 176/72   Pulse 69   Temp 98.2 F (36.8 C)   Wt 150 lb 12.8 oz (68.4 kg)   SpO2 98%   BMI 31.52 kg/m  Physical Exam Vitals and nursing note reviewed.  Constitutional:      General: She is not in acute distress.    Appearance: Normal appearance. She is not ill-appearing or toxic-appearing.  HENT:     Head: Normocephalic and atraumatic.     Right Ear: Tympanic membrane, ear canal and external ear normal. There is no impacted cerumen.     Left Ear: Tympanic membrane, ear canal and external ear normal. There is no impacted cerumen.     Nose: No congestion or rhinorrhea.     Mouth/Throat:     Mouth: Mucous membranes are moist.     Pharynx: Oropharynx is clear. No oropharyngeal exudate or posterior oropharyngeal erythema.  Eyes:     General: No scleral icterus.       Right eye: No discharge.        Left eye: No discharge.     Extraocular Movements: Extraocular movements intact.     Conjunctiva/sclera: Conjunctivae normal.     Pupils: Pupils are equal, round, and reactive to light.  Cardiovascular:     Rate and Rhythm: Normal rate and regular rhythm.     Pulses: Normal pulses.     Heart sounds: Normal heart sounds. No murmur heard.    No friction rub. No gallop.  Pulmonary:     Effort: Pulmonary effort is normal. No respiratory distress.     Breath sounds: Normal breath sounds. No stridor. No wheezing, rhonchi or rales.   Chest:     Chest wall: No tenderness.  Abdominal:     General: Abdomen is flat. Bowel sounds are normal. There is no distension.     Palpations: Abdomen is soft. There is no mass.     Tenderness: There is no abdominal tenderness. There is no right CVA tenderness, left CVA tenderness, guarding or rebound.     Hernia: No hernia is present.  Musculoskeletal:        General: No swelling, tenderness or deformity. Normal range of motion.     Cervical back: Normal range of motion and neck supple. No rigidity or  tenderness.     Right lower leg: No edema.     Left lower leg: No edema.  Lymphadenopathy:     Cervical: No cervical adenopathy.  Skin:    General: Skin is warm and dry.     Coloration: Skin is not jaundiced or pale.     Findings: No bruising, erythema, lesion or rash.  Neurological:     General: No focal deficit present.     Mental Status: She is alert and oriented to person, place, and time. Mental status is at baseline.     Cranial Nerves: No cranial nerve deficit.     Sensory: No sensory deficit.     Motor: No weakness.     Coordination: Coordination normal.     Gait: Gait normal.     Deep Tendon Reflexes: Reflexes normal.  Psychiatric:        Mood and Affect: Mood normal.        Behavior: Behavior normal.        Thought Content: Thought content normal.        Judgment: Judgment normal.     No results found.  Assessment/plan: Cieara Stierwalt is a 66 y.o. female present for [place] Essential hypertension/HLD/obesity Above goal She had a dental procedure and is not comfortable today- which may be contributing to BP. However, BP has been elevated multiple times over the last visits> increase regimen today increase  lisinopril-hctz 20-12.'5mg'$  qd to 1.5 tabs Low sodium, heart healthy diet.  Routine exercise Continue atorvastatin 40 mg nightly - CBC - Comprehensive metabolic panel - Lipid panel - TSH   Prediabetes -Has been doing great watching her diet and losing  weight. -Last A1c was 6.2. -a1c collected today  Vitamin D deficiency/Osteopenia of multiple sites - dexa > due 2023-2025> [place] - VITAMIN D 25 Hydroxy (Vit-D Deficiency, Fractures)  Routine general medical examination at a health care facility Colonoscopy: no fhx. + cologuard 11/2020> [facility]oscopy at [facility] 03/2021- 5 yr f/u per pt>  Mammogram: no fhx, completed: 09/2021 > has been ordered- encouraged her to schedule --  Breast center Cervical cancer screening: Not indicated> 65 Immunizations: tdap UTD11/2019, Influenza declined(encouraged yearly), Shingrix series completed,Prevnar20declined Infectious disease screening: HIV and  Hep C completed Patient was encouraged to exercise greater than 150 minutes a week. Patient was encouraged to choose a diet filled with fresh fruits and vegetables, and lean meats. AVS provided to patient today for education/recommendation on gender specific health and safety maintenance.  Return in about 2 weeks (around 10/13/2022) for hypertension follow up.  Orders Placed This Encounter  Procedures   CBC   Comprehensive metabolic panel   Hemoglobin A1c   Lipid panel   TSH   VITAMIN D 25 Hydroxy (Vit-D Deficiency, Fractures)   Meds ordered this encounter  Medications   atorvastatin (LIPITOR) 80 MG tablet    Sig: Take 1 tablet (80 mg total) by mouth daily.    Dispense:  90 tablet    Refill:  3   lisinopril-hydrochlorothiazide (ZESTORETIC) 20-12.5 MG tablet    Sig: Take 1.5 tablets by mouth daily.    Dispense:  135 tablet    Refill:  1   Referral Orders  No referral(s) requested today     Electronically signed by: Howard Pouch, [place]

## 2022-09-30 ENCOUNTER — Telehealth: Payer: Self-pay | Admitting: Family Medicine

## 2022-09-30 NOTE — Telephone Encounter (Signed)
No further action needed.

## 2022-09-30 NOTE — Telephone Encounter (Signed)
Provided patient with lab results. She understood results and had no additional concerns or questions.

## 2022-10-01 ENCOUNTER — Encounter: Payer: PPO | Admitting: Family Medicine

## 2022-11-11 DIAGNOSIS — H35033 Hypertensive retinopathy, bilateral: Secondary | ICD-10-CM | POA: Diagnosis not present

## 2022-11-11 DIAGNOSIS — H33312 Horseshoe tear of retina without detachment, left eye: Secondary | ICD-10-CM | POA: Diagnosis not present

## 2022-11-11 DIAGNOSIS — H35371 Puckering of macula, right eye: Secondary | ICD-10-CM | POA: Diagnosis not present

## 2022-11-11 DIAGNOSIS — H43813 Vitreous degeneration, bilateral: Secondary | ICD-10-CM | POA: Diagnosis not present

## 2022-11-18 DIAGNOSIS — H35032 Hypertensive retinopathy, left eye: Secondary | ICD-10-CM | POA: Diagnosis not present

## 2022-11-18 DIAGNOSIS — H43812 Vitreous degeneration, left eye: Secondary | ICD-10-CM | POA: Diagnosis not present

## 2022-11-18 DIAGNOSIS — Z9889 Other specified postprocedural states: Secondary | ICD-10-CM | POA: Diagnosis not present

## 2022-11-24 DIAGNOSIS — H35032 Hypertensive retinopathy, left eye: Secondary | ICD-10-CM | POA: Diagnosis not present

## 2022-11-24 DIAGNOSIS — H43812 Vitreous degeneration, left eye: Secondary | ICD-10-CM | POA: Diagnosis not present

## 2022-11-24 DIAGNOSIS — H59812 Chorioretinal scars after surgery for detachment, left eye: Secondary | ICD-10-CM | POA: Diagnosis not present

## 2023-01-09 DIAGNOSIS — H43813 Vitreous degeneration, bilateral: Secondary | ICD-10-CM | POA: Diagnosis not present

## 2023-01-09 DIAGNOSIS — H35033 Hypertensive retinopathy, bilateral: Secondary | ICD-10-CM | POA: Diagnosis not present

## 2023-01-09 DIAGNOSIS — H59812 Chorioretinal scars after surgery for detachment, left eye: Secondary | ICD-10-CM | POA: Diagnosis not present

## 2023-03-16 ENCOUNTER — Ambulatory Visit (INDEPENDENT_AMBULATORY_CARE_PROVIDER_SITE_OTHER): Payer: PPO | Admitting: Family Medicine

## 2023-03-16 ENCOUNTER — Encounter: Payer: Self-pay | Admitting: Family Medicine

## 2023-03-16 VITALS — BP 157/75 | HR 65 | Temp 97.6°F | Wt 154.6 lb

## 2023-03-16 DIAGNOSIS — E559 Vitamin D deficiency, unspecified: Secondary | ICD-10-CM | POA: Diagnosis not present

## 2023-03-16 DIAGNOSIS — M8589 Other specified disorders of bone density and structure, multiple sites: Secondary | ICD-10-CM

## 2023-03-16 DIAGNOSIS — L309 Dermatitis, unspecified: Secondary | ICD-10-CM

## 2023-03-16 DIAGNOSIS — E785 Hyperlipidemia, unspecified: Secondary | ICD-10-CM | POA: Diagnosis not present

## 2023-03-16 DIAGNOSIS — I10 Essential (primary) hypertension: Secondary | ICD-10-CM

## 2023-03-16 DIAGNOSIS — R7303 Prediabetes: Secondary | ICD-10-CM

## 2023-03-16 DIAGNOSIS — H35033 Hypertensive retinopathy, bilateral: Secondary | ICD-10-CM | POA: Diagnosis not present

## 2023-03-16 DIAGNOSIS — H43813 Vitreous degeneration, bilateral: Secondary | ICD-10-CM | POA: Diagnosis not present

## 2023-03-16 DIAGNOSIS — N61 Mastitis without abscess: Secondary | ICD-10-CM

## 2023-03-16 DIAGNOSIS — H59812 Chorioretinal scars after surgery for detachment, left eye: Secondary | ICD-10-CM | POA: Diagnosis not present

## 2023-03-16 LAB — POCT GLYCOSYLATED HEMOGLOBIN (HGB A1C)
HbA1c POC (<> result, manual entry): 5.8 % (ref 4.0–5.6)
HbA1c, POC (controlled diabetic range): 5.8 % (ref 0.0–7.0)
HbA1c, POC (prediabetic range): 5.8 % (ref 5.7–6.4)
Hemoglobin A1C: 5.8 % — AB (ref 4.0–5.6)

## 2023-03-16 MED ORDER — LISINOPRIL-HYDROCHLOROTHIAZIDE 20-12.5 MG PO TABS: 1.5000 | ORAL_TABLET | Freq: Every day | ORAL | 1 refills | Status: DC

## 2023-03-16 MED ORDER — CLOTRIMAZOLE 1 % EX CREA: TOPICAL_CREAM | CUTANEOUS | 0 refills | Status: DC

## 2023-03-16 NOTE — Patient Instructions (Addendum)
Return in about 7 months (around 10/02/2023) for cpe (20 min), Routine chronic condition follow-up.        Great to see you today.  I have refilled the medication(s) we provide.   If labs were collected, we will inform you of lab results once received either by echart message or telephone call.   - echart message- for normal results that have been seen by the patient already.   - telephone call: abnormal results or if patient has not viewed results in their echart.

## 2023-03-16 NOTE — Progress Notes (Signed)
Patient ID: Gloria [name], female  DOB: Jun 17, 1956, 67 y.o.   MRN: [id number] Patient Care Team    Relationship Specialty Notifications Start End  Natalia Leatherwood, DO PCP - General Family Medicine  08/20/15   Genia Del, MD Consulting Physician Obstetrics and Gynecology  09/24/18   Specialists, [facility]  Gastroenterology  09/30/21     Chief Complaint  Patient presents with   Hypertension    Subjective: Gloria [name] is a 67 y.o.  Female  present for Chronic Conditions/illness Management  All past medical history, surgical history, allergies, family history, immunizations, medications and social history were updated in the electronic medical record today. All recent labs, ED visits and hospitalizations within the last year were reviewed.  Hypertension/HLD/obesity:  Pt reports she has only been taking 1 tab a day, she forgot to increase last visit to 1.5 tabs lisinopril -HCTZ 20-12.5 mg.  Increased last visit. Patient denies chest pain, shortness of breath, dizziness or lower extremity edema.   Pt takes a daily baby ASA.  She is prescribed statin.  Diet: Tries to follow a low-salt, low saturated, low sugar diet. Exercise: Does not exercise routinely RF: Hypertension, hyperlipidemia, obesity, history of heart disease in her father.   Diabetes: She has modified her diet, she cut back on sweets.  She has lost weight.   A1c has been in the prediabetic range and not required medication.    07/18/2022    1:13 PM 04/07/2022    9:07 AM 09/30/2021    8:21 AM 07/03/2021    9:00 AM 10/01/2020    9:27 AM  Depression screen PHQ 2/9  Decreased Interest 0 0 0 0 0  Down, Depressed, Hopeless 0 0 0 0 0  PHQ - 2 Score 0 0 0 0 0       No data to display          Immunization History  Administered Date(s) Administered   Influenza,inj,quad, With Preservative 08/24/2019   Influenza-Unspecified 10/03/2014, 09/04/2015, 08/17/2018   Td 11/04/2007   Tdap 09/24/2018   Zoster  Recombinat (Shingrix) 09/24/2018, 12/06/2018    Past Medical History:  Diagnosis Date   Allergy    High cholesterol    Hypertension    Nipple dermatitis 01/18/2016   Positive colorectal cancer screening using Cologuard test 11/15/2020   [facility] after [facility]- 5 yr f/u   Seasonal allergies    No Known Allergies Past Surgical History:  Procedure Laterality Date   INNER EAR SURGERY     NO PAST SURGERIES     Family History  Problem Relation Age of Onset   Heart disease Father    Alcohol abuse Brother    Kidney disease Brother    Breast cancer Neg Hx    Social History   Social History Narrative   Gloria [name] is married.  She lives with husband & adopted daughter. She works FT at [employer].   Smoke detector in the home, wears her seatbelt.     Allergies as of 03/16/2023   No Known Allergies      Medication List        Accurate as of Mar 16, 2023  2:46 PM. If you have any questions, ask your nurse or doctor.          aspirin 81 MG chewable tablet Chew by mouth daily. One tablet every other day   atorvastatin 80 MG tablet Commonly known as: LIPITOR Take 1 tablet (80 mg total) by  mouth daily.   cholecalciferol 25 MCG (1000 UNIT) tablet Commonly known as: VITAMIN D3 Take 1,000 Units by mouth daily.   clotrimazole 1 % cream Commonly known as: Clotrimazole AF Apply to affected area BID. Started by: Felix Pacini, DO   lisinopril-hydrochlorothiazide 20-12.5 MG tablet Commonly known as: ZESTORETIC Take 1.5 tablets by mouth daily.        All past medical history, surgical history, allergies, family history, immunizations andmedications were updated in the EMR today and reviewed under the history and medication portions of their EMR.     Recent Results (from the past 2160 hour(s))  POCT HgB A1C     Status: Abnormal   Collection Time: 03/16/23  2:25 PM  Result Value Ref Range   Hemoglobin A1C 5.8 (A) 4.0 - 5.6 %   HbA1c POC (<>  result, manual entry) 5.8 4.0 - 5.6 %   HbA1c, POC (prediabetic range) 5.8 5.7 - 6.4 %   HbA1c, POC (controlled diabetic range) 5.8 0.0 - 7.0 %    MM 3D SCREEN BREAST BILATERAL Result Date: 09/13/2021 RECOMMENDATION: Screening mammogram in one year. (Code:SM-B-01Y) BI-RADS CATEGORY  1: Negative. Electronically Signed   By: Ted Mcalpine M.D.   On: 09/13/2021 18:00    ROS: 14 pt review of systems performed and negative (unless mentioned in an HPI)  Objective: BP (!) 157/75   Pulse 65   Temp 97.6 F (36.4 C)   Wt 154 lb 9.6 oz (70.1 kg)   SpO2 97%   BMI 32.31 kg/m  Physical Exam Vitals and nursing note reviewed.  Constitutional:      General: She is not in acute distress.    Appearance: Normal appearance. She is not ill-appearing, toxic-appearing or diaphoretic.  HENT:     Head: Normocephalic and atraumatic.  Eyes:     General: No scleral icterus.       Right eye: No discharge.        Left eye: No discharge.     Extraocular Movements: Extraocular movements intact.     Conjunctiva/sclera: Conjunctivae normal.     Pupils: Pupils are equal, round, and reactive to light.  Cardiovascular:     Rate and Rhythm: Normal rate and regular rhythm.     Heart sounds: No murmur heard. Pulmonary:     Effort: Pulmonary effort is normal. No respiratory distress.     Breath sounds: Normal breath sounds. No wheezing, rhonchi or rales.  Musculoskeletal:     Right lower leg: No edema.     Left lower leg: No edema.  Skin:    General: Skin is warm.     Findings: No rash.  Neurological:     Mental Status: She is alert and oriented to person, place, and time. Mental status is at baseline.     Motor: No weakness.     Gait: Gait normal.  Psychiatric:        Mood and Affect: Mood normal.        Behavior: Behavior normal.        Thought Content: Thought content normal.        Judgment: Judgment normal.     No results found.  Assessment/plan: Anyssa [name] is a 67 y.o. female present  for chronic condition management Essential hypertension/HLD/obesity Would likely be in range if she had increased to 1.5 tabs (she forgot) START  lisinopril-hctz 20-12.5mg  qd to 1.5 tabs Low sodium, heart healthy diet.  Routine exercise Continue atorvastatin 40 mg nightly  Prediabetes -Has been doing  great watching her diet and losing weight. -Last A1c was 6.2.> 6.1< 5.8 a1c collected today  Vitamin D deficiency/Osteopenia of multiple sites - dexa > due 2023-2025> [place] -Vitamin D levels up-to-date 09/2022   Return in about 7 months (around 10/02/2023) for cpe (20 min), Routine chronic condition follow-up.  Orders Placed This Encounter  Procedures   POCT HgB A1C   Meds ordered this encounter  Medications   clotrimazole (CLOTRIMAZOLE AF) 1 % cream    Sig: Apply to affected area BID.    Dispense:  30 g    Refill:  0   lisinopril-hydrochlorothiazide (ZESTORETIC) 20-12.5 MG tablet    Sig: Take 1.5 tablets by mouth daily.    Dispense:  135 tablet    Refill:  1   Referral Orders  No referral(s) requested today     Electronically signed by: Felix Pacini, DO [facility]

## 2023-03-31 ENCOUNTER — Telehealth: Payer: Self-pay | Admitting: Family Medicine

## 2023-03-31 NOTE — Telephone Encounter (Signed)
Patient calls and reports that the cream prescribed has not been helping her rash. She is requesting if something else can be called in. She denies it worsening, but states its itchy. She is informed Dr. Claiborne Billings is out of office, and declined to schedule an appointment at this time.   clotrimazole (CLOTRIMAZOLE AF) 1 % cream

## 2023-06-18 ENCOUNTER — Encounter (INDEPENDENT_AMBULATORY_CARE_PROVIDER_SITE_OTHER): Payer: Self-pay

## 2023-06-18 ENCOUNTER — Other Ambulatory Visit: Payer: Self-pay | Admitting: Family Medicine

## 2023-06-19 DIAGNOSIS — H35373 Puckering of macula, bilateral: Secondary | ICD-10-CM | POA: Diagnosis not present

## 2023-06-19 DIAGNOSIS — H59812 Chorioretinal scars after surgery for detachment, left eye: Secondary | ICD-10-CM | POA: Diagnosis not present

## 2023-06-19 DIAGNOSIS — H43813 Vitreous degeneration, bilateral: Secondary | ICD-10-CM | POA: Diagnosis not present

## 2023-06-19 DIAGNOSIS — H2513 Age-related nuclear cataract, bilateral: Secondary | ICD-10-CM | POA: Diagnosis not present

## 2023-06-19 DIAGNOSIS — H35033 Hypertensive retinopathy, bilateral: Secondary | ICD-10-CM | POA: Diagnosis not present

## 2023-06-24 ENCOUNTER — Encounter: Payer: Self-pay | Admitting: Pharmacist

## 2023-06-24 NOTE — Progress Notes (Unsigned)
Pharmacy Quality Measure Review  This patient is appearing on a report for being at risk of failing the adherence measure for cholesterol (statin) medications this calendar year.   Medication: atorvastatin 80mg  Last fill date: 12/27/2022 for 90 day supply  Reviewed refill history in Epic and looks like atorvastatin was fill for 90 DS on 5/29 and 07/09.  Called [facility] to verify if Rx was picked on 5/29 or 07/09 - per [facility] those refills were reversed and she last filled 12/27/2022

## 2023-07-29 ENCOUNTER — Ambulatory Visit (INDEPENDENT_AMBULATORY_CARE_PROVIDER_SITE_OTHER): Payer: PPO

## 2023-07-29 VITALS — BP 134/74 | HR 71 | Temp 98.4°F | Wt 155.8 lb

## 2023-07-29 DIAGNOSIS — Z0000 Encounter for general adult medical examination without abnormal findings: Secondary | ICD-10-CM | POA: Diagnosis not present

## 2023-07-29 NOTE — Progress Notes (Signed)
Subjective:   Gloria [name] is a 67 y.o. female who presents for Medicare Annual (Subsequent) preventive examination.  Visit Complete: In person  Patient Medicare AWV questionnaire was completed by the patient on 07/29/23; I have confirmed that all information answered by patient is correct and no changes since this date.  Cardiac Risk Factors include: advanced age (>6men, >52 women);hypertension     Objective:    Today's Vitals   07/29/23 1429  BP: 134/74  Pulse: 71  Temp: 98.4 F (36.9 C)  SpO2: 95%  Weight: 155 lb 12.8 oz (70.7 kg)   Body mass index is 32.56 kg/m.     07/18/2022    1:11 PM 07/03/2021    8:49 AM  Advanced Directives  Does Patient Have a Medical Advance Directive? Yes No  Type of Estate agent of [place];Living will   Does patient want to make changes to medical advance directive? No - Patient declined   Copy of Healthcare Power of Attorney in Chart? No - copy requested   Would patient like information on creating a medical advance directive?  No - Patient declined    Current Medications (verified) Outpatient Encounter Medications as of 07/29/2023  Medication Sig   aspirin 81 MG chewable tablet Chew by mouth daily. One tablet every other day   atorvastatin (LIPITOR) 80 MG tablet Take 1 tablet (80 mg total) by mouth daily.   cholecalciferol (VITAMIN D3) 25 MCG (1000 UT) tablet Take 1,000 Units by mouth daily.   clotrimazole (CLOTRIMAZOLE AF) 1 % cream Apply to affected area BID.   lisinopril-hydrochlorothiazide (ZESTORETIC) 20-12.5 MG tablet Take 1.5 tablets by mouth daily.   No facility-administered encounter medications on file as of 07/29/2023.    Allergies (verified) Patient has no known allergies.   History: Past Medical History:  Diagnosis Date   Allergy    High cholesterol    Hypertension    Nipple dermatitis 01/18/2016   Positive colorectal cancer screening using Cologuard test 11/15/2020   colonoscopy after at  [facility]- 5 yr f/u   Seasonal allergies    Past Surgical History:  Procedure Laterality Date   INNER EAR SURGERY     NO PAST SURGERIES     Family History  Problem Relation Age of Onset   Heart disease Father    Alcohol abuse Brother    Kidney disease Brother    Breast cancer Neg Hx    Social History   Socioeconomic History   Marital status: Married    Spouse name: Not on file   Number of children: 1   Years of education: 4 yr [place]   Highest education level: Not on file  Occupational History   Occupation: CNA    Employer: [employer]    Comment: [employer]  Tobacco Use   Smoking status: Never   Smokeless tobacco: Never  Vaping Use   Vaping status: Never Used  Substance and Sexual Activity   Alcohol use: No    Alcohol/week: 0.0 standard drinks of alcohol   Drug use: No   Sexual activity: Yes    Birth control/protection: Post-menopausal  Other Topics Concern   Not on file  Social History Narrative   Ms Olszowy is married.  She lives with husband & adopted daughter. She works FT at [employer].   Smoke detector in the home, wears her seatbelt.    Social Determinants of Health   Financial Resource Strain: Low Risk  (07/29/2023)   Overall Physicist, medical Strain (  CARDIA)    Difficulty of Paying Living Expenses: Not hard at all  Food Insecurity: No Food Insecurity (07/29/2023)   Hunger Vital Sign    Worried About Running Out of Food in the Last Year: Never true    Ran Out of Food in the Last Year: Never true  Transportation Needs: No Transportation Needs (07/29/2023)   PRAPARE - Administrator, Civil Service (Medical): No    Lack of Transportation (Non-Medical): No  Physical Activity: Sufficiently Active (07/29/2023)   Exercise Vital Sign    Days of Exercise per Week: 5 days    Minutes of Exercise per Session: 30 min  Stress: No Stress Concern Present (07/29/2023)   [employer] of Occupational Health - Occupational Stress  Questionnaire    Feeling of Stress : Not at all  Social Connections: Moderately Integrated (07/29/2023)   Social Connection and Isolation Panel [NHANES]    Frequency of Communication with Friends and Family: More than three times a week    Frequency of Social Gatherings with Friends and Family: More than three times a week    Attends Religious Services: More than 4 times per year    Active Member of [employer] or Organizations: No    Attends Engineer, structural: Never    Marital Status: Married    Tobacco Counseling Counseling given: Not Answered   Clinical Intake:  Pre-visit preparation completed: No  Pain : No/denies pain     Nutritional Risks: None Diabetes: No  How often do you need to have someone help you when you read instructions, pamphlets, or other written materials from your doctor or pharmacy?: 1 - Never  Interpreter Needed?: No      Activities of Daily Living    07/29/2023    2:31 PM  In your present state of health, do you have any difficulty performing the following activities:  Hearing? 0  Vision? 0  Difficulty concentrating or making decisions? 0  Walking or climbing stairs? 0  Dressing or bathing? 0  Doing errands, shopping? 0  Preparing Food and eating ? N  Using the Toilet? N  In the past six months, have you accidently leaked urine? N  Do you have problems with loss of bowel control? N  Managing your Medications? N  Managing your Finances? N  Housekeeping or managing your Housekeeping? N    Patient Care Team: Natalia Leatherwood, DO as PCP - General (Family Medicine) Genia Del, MD as Consulting Physician (Obstetrics and Gynecology) Specialists, Digestive Health (Gastroenterology)  Indicate any recent Medical Services you may have received from other than [facility] providers in the past year (date may be approximate).     Assessment:   This is a routine wellness examination for Gloria [name].  Hearing/Vision screen No results found.    Goals Addressed             This Visit's Progress    Patient Stated       N/A       Depression Screen    07/29/2023    3:49 PM 07/18/2022    1:13 PM 04/07/2022    9:07 AM 09/30/2021    8:21 AM 07/03/2021    9:00 AM 10/01/2020    9:27 AM 09/26/2019   12:54 PM  PHQ 2/9 Scores  PHQ - 2 Score 0 0 0 0 0 0 0    Fall Risk    07/29/2023    3:49 PM 04/07/2023    9:08 AM 04/07/2022  9:07 AM 09/30/2021    8:21 AM 07/03/2021    8:50 AM  Fall Risk   Falls in the past year? 0 0 0 0 0  Number falls in past yr: 0 0 0 0 0  Injury with Fall? 0 0 0 0 0  Risk for fall due to : No Fall Risks No Fall Risks No Fall Risks    Follow up Falls evaluation completed Falls evaluation completed Falls evaluation completed Falls evaluation completed Falls evaluation completed;Falls prevention discussed    MEDICARE RISK AT HOME: Medicare Risk at Home Any stairs in or around the home?: Yes If so, are there any without handrails?: Yes Home free of loose throw rugs in walkways, pet beds, electrical cords, etc?: Yes Adequate lighting in your home to reduce risk of falls?: No Life alert?: No Use of a cane, walker or w/c?: No Grab bars in the bathroom?: Yes Shower chair or bench in shower?: No Elevated toilet seat or a handicapped toilet?: No  TIMED UP AND GO:  Was the test performed?  Yes  Length of time to ambulate 10 feet: 5 sec Gait steady and fast without use of assistive device    Cognitive Function:        07/29/2023    2:30 PM 07/18/2022    1:13 PM  6CIT Screen  What Year? 0 points 0 points  What month? 0 points 0 points  What time? 0 points 0 points  Count back from 20 0 points 0 points  Months in reverse 0 points 0 points  Repeat phrase 0 points 0 points  Total Score 0 points 0 points    Immunizations Immunization History  Administered Date(s) Administered   Influenza,inj,quad, With Preservative 08/24/2019   Influenza-Unspecified 10/03/2014, 09/04/2015, 08/17/2018   Td  11/04/2007   Tdap 09/24/2018   Zoster Recombinant(Shingrix) 09/24/2018, 12/06/2018    TDAP status: Up to date  Flu Vaccine status: Declined, Education has been provided regarding the importance of this vaccine but patient still declined. Advised may receive this vaccine at local pharmacy or Health Dept. Aware to provide a copy of the vaccination record if obtained from local pharmacy or Health Dept. Verbalized acceptance and understanding.  Pneumococcal vaccine status: Declined,  Education has been provided regarding the importance of this vaccine but patient still declined. Advised may receive this vaccine at local pharmacy or Health Dept. Aware to provide a copy of the vaccination record if obtained from local pharmacy or Health Dept. Verbalized acceptance and understanding.   Covid-19 vaccine status: Completed vaccines  Qualifies for Shingles Vaccine? Yes   Zostavax completed No   Shingrix Completed?: Yes  Screening Tests Health Maintenance  Topic Date Due   INFLUENZA VACCINE  02/01/2024 (Originally 06/04/2023)   Pneumonia Vaccine 68+ Years old (1 of 1 - PCV) 07/28/2024 (Originally 06/09/2020)   MAMMOGRAM  09/14/2023   DEXA SCAN  01/07/2024   Medicare Annual Wellness (AWV)  07/28/2024   Colonoscopy  03/20/2026   DTaP/Tdap/Td (3 - Td or Tdap) 09/24/2028   Hepatitis C Screening  Completed   Zoster Vaccines- Shingrix  Completed   HPV VACCINES  Aged Out   COVID-19 Vaccine  Discontinued   Fecal DNA (Cologuard)  Discontinued    Health Maintenance  There are no preventive care reminders to display for this patient.   Colorectal cancer screening: Type of screening: Colonoscopy. Completed 03/20/21. Repeat every 5 years  Mammogram status: Completed 09/13/21. Repeat every year  Bone Density status: Completed 01/07/2019. Results reflect: Bone  density results: OSTEOPENIA. Repeat every 2 years.  Lung Cancer Screening: (Low Dose CT Chest recommended if Age 66-80 years, 20 pack-year  currently smoking OR have quit w/in 15years.) does not qualify.   Lung Cancer Screening Referral: N/A  Additional Screening:  Hepatitis C Screening: does qualify; Completed 09/24/2018  Vision Screening: Recommended annual ophthalmology exams for early detection of glaucoma and other disorders of the eye. Is the patient up to date with their annual eye exam?  Yes  Who is the provider or what is the name of the office in which the patient attends annual eye exams? N/A If pt is not established with a provider, would they like to be referred to a provider to establish care? No .   Dental Screening: Recommended annual dental exams for proper oral hygiene    Community Resource Referral / Chronic Care Management: CRR required this visit?  No   CCM required this visit?  No     Plan:     I have personally reviewed and noted the following in the patient's chart:   Medical and social history Use of alcohol, tobacco or illicit drugs  Current medications and supplements including opioid prescriptions. Patient is not currently taking opioid prescriptions. Functional ability and status Nutritional status Physical activity Advanced directives List of other physicians Hospitalizations, surgeries, and ER visits in previous 12 months Vitals Screenings to include cognitive, depression, and falls Referrals and appointments  In addition, I have reviewed and discussed with patient certain preventive protocols, quality metrics, and best practice recommendations. A written personalized care plan for preventive services as well as general preventive health recommendations were provided to patient.     Filomena Jungling, CMA   07/29/2023   After Visit Summary: (In Person-Declined) Patient declined AVS at this time.  Nurse Notes: Non-Face to Face or Face to Face 10 minute visit Encounter     Ms. Garand , Thank you for taking time to come for your Medicare Wellness Visit. I appreciate your  ongoing commitment to your health goals. Please review the following plan we discussed and let me know if I can assist you in the future.   These are the goals we discussed:  Goals      Patient Stated     Continue current lifestyle     Patient Stated     N/A        This is a list of the screening recommended for you and due dates:  Health Maintenance  Topic Date Due   Flu Shot  02/01/2024*   Pneumonia Vaccine (1 of 1 - PCV) 07/28/2024*   Mammogram  09/14/2023   DEXA scan (bone density measurement)  01/07/2024   Medicare Annual Wellness Visit  07/28/2024   Colon Cancer Screening  03/20/2026   DTaP/Tdap/Td vaccine (3 - Td or Tdap) 09/24/2028   Hepatitis C Screening  Completed   Zoster (Shingles) Vaccine  Completed   HPV Vaccine  Aged Out   COVID-19 Vaccine  Discontinued   Cologuard (Stool DNA test)  Discontinued  *Topic was postponed. The date shown is not the original due date.

## 2023-07-29 NOTE — Patient Instructions (Signed)

## 2023-09-09 DIAGNOSIS — H35373 Puckering of macula, bilateral: Secondary | ICD-10-CM | POA: Diagnosis not present

## 2023-09-11 ENCOUNTER — Encounter: Payer: Self-pay | Admitting: Family Medicine

## 2023-09-11 ENCOUNTER — Ambulatory Visit (INDEPENDENT_AMBULATORY_CARE_PROVIDER_SITE_OTHER): Payer: PPO | Admitting: Family Medicine

## 2023-09-11 VITALS — BP 170/92 | HR 79 | Wt 154.4 lb

## 2023-09-11 DIAGNOSIS — R7303 Prediabetes: Secondary | ICD-10-CM | POA: Diagnosis not present

## 2023-09-11 DIAGNOSIS — E559 Vitamin D deficiency, unspecified: Secondary | ICD-10-CM | POA: Diagnosis not present

## 2023-09-11 DIAGNOSIS — M8589 Other specified disorders of bone density and structure, multiple sites: Secondary | ICD-10-CM | POA: Diagnosis not present

## 2023-09-11 DIAGNOSIS — E669 Obesity, unspecified: Secondary | ICD-10-CM | POA: Diagnosis not present

## 2023-09-11 DIAGNOSIS — Z91148 Patient's other noncompliance with medication regimen for other reason: Secondary | ICD-10-CM | POA: Insufficient documentation

## 2023-09-11 DIAGNOSIS — Z23 Encounter for immunization: Secondary | ICD-10-CM | POA: Diagnosis not present

## 2023-09-11 DIAGNOSIS — E785 Hyperlipidemia, unspecified: Secondary | ICD-10-CM

## 2023-09-11 DIAGNOSIS — I10 Essential (primary) hypertension: Secondary | ICD-10-CM | POA: Diagnosis not present

## 2023-09-11 LAB — POCT GLYCOSYLATED HEMOGLOBIN (HGB A1C)
HbA1c POC (<> result, manual entry): 5.8 % (ref 4.0–5.6)
HbA1c, POC (controlled diabetic range): 5.8 % (ref 0.0–7.0)
HbA1c, POC (prediabetic range): 5.8 % (ref 5.7–6.4)
Hemoglobin A1C: 5.8 % — AB (ref 4.0–5.6)

## 2023-09-11 MED ORDER — LOSARTAN POTASSIUM-HCTZ 100-25 MG PO TABS: 1.0000 | ORAL_TABLET | Freq: Every day | ORAL | 1 refills | Status: DC

## 2023-09-11 MED ORDER — ATORVASTATIN CALCIUM 80 MG PO TABS: 80.0000 mg | ORAL_TABLET | Freq: Every day | ORAL | 3 refills | Status: AC

## 2023-09-11 NOTE — Patient Instructions (Addendum)
Return in about 12 weeks (around 12/04/2023) for cpe (20 min), Routine chronic condition follow-up.   Next appt is your physical. We will collect fasting labs that appointment as well.   Med change: I have change your blood pressure med to a different type, so you only have to take 1 tab a day. MAKE SURE TO HAVE YOUR MEDICATION IN YOUR SYSTEM next visit, at least 2 hours prior to appt. This is very very important.        Great to see you today.  I have refilled the medication(s) we provide.   If labs were collected or images ordered, we will inform you of  results once we have received them and reviewed. We will contact you either by echart message, or telephone call.  Please give ample time to the testing facility, and our office to run,  receive and review results. Please do not call inquiring of results, even if you can see them in your chart. We will contact you as soon as we are able. If it has been over 1 week since the test was completed, and you have not yet heard from [place], then please call us.    - echart message- for normal results that have been seen by the patient already.   - telephone call: abnormal results or if patient has not viewed results in their echart.  If a referral to a specialist was entered for you, please call us in 2 weeks if you have not heard from the specialist office to schedule.

## 2023-09-11 NOTE — Progress Notes (Signed)
Patient ID: Gloria [name], female  DOB: 1955-12-03, 67 y.o.   MRN: [id number] Patient Care Team    Relationship Specialty Notifications Start End  Natalia Leatherwood, DO PCP - General Family Medicine  08/20/15   Genia Del, MD Consulting Physician Obstetrics and Gynecology  09/24/18   Specialists, [facility]  Gastroenterology  09/30/21     Chief Complaint  Patient presents with   Hyperlipidemia    CMC; Brother passed away this morning; forgot to take meds last night and no meds taken this morning   Hypertension    Subjective: [name] is a 67 y.o.  Female  present for Chronic Conditions/illness Management All past medical history, surgical history, allergies, family history, immunizations, medications and social history were updated in the electronic medical record today. All recent labs, ED visits and hospitalizations within the last year were reviewed.  Hypertension/HLD/obesity:  Pt reports she again did not take her medicine today of lisinopril -HCTZ 20-12.5 mg.   Patient denies chest pain, shortness of breath, dizziness or lower extremity edema.  Pt takes a daily baby ASA.  She is prescribed statin.  Diet: Tries to follow a low-salt, low saturated, low sugar diet. Exercise: Does not exercise routinely RF: Hypertension, hyperlipidemia, obesity, history of heart disease in her father.   Diabetes: She has modified her diet, she cut back on sweets.  She has lost weight.   A1c has been in the prediabetic range and not required medication.     07/29/2023    3:49 PM 07/18/2022    1:13 PM 04/07/2022    9:07 AM 09/30/2021    8:21 AM 07/03/2021    9:00 AM  Depression screen PHQ 2/9  Decreased Interest 0 0 0 0 0  Down, Depressed, Hopeless 0 0 0 0 0  PHQ - 2 Score 0 0 0 0 0       No data to display          Immunization History  Administered Date(s) Administered   Influenza,inj,quad, With Preservative 08/24/2019   Influenza-Unspecified 10/03/2014, 09/04/2015,  08/17/2018, 08/04/2023   Td 11/04/2007   Tdap 09/24/2018   Zoster Recombinant(Shingrix) 09/24/2018, 12/06/2018    Past Medical History:  Diagnosis Date   Allergy    High cholesterol    Hypertension    Nipple dermatitis 01/18/2016   Positive colorectal cancer screening using Cologuard test 11/15/2020   colonoscopy after [facility]- 5 yr f/u   Seasonal allergies    No Known Allergies Past Surgical History:  Procedure Laterality Date   INNER EAR SURGERY     NO PAST SURGERIES     Family History  Problem Relation Age of Onset   Heart disease Father    Alcohol abuse Brother    Kidney disease Brother    Breast cancer Neg Hx    Social History   Social History Narrative   [name] is married.  She lives with husband & adopted daughter. She works FT at [employer].   Smoke detector in the home, wears her seatbelt.     Allergies as of 09/11/2023   No Known Allergies      Medication List        Accurate as of September 11, 2023  8:36 AM. If you have any questions, ask your nurse or doctor.          STOP taking these medications    lisinopril-hydrochlorothiazide 20-12.5 MG tablet Commonly known as: ZESTORETIC Stopped by: Felix Pacini  TAKE these medications    aspirin 81 MG chewable tablet Chew by mouth daily. One tablet every other day   atorvastatin 80 MG tablet Commonly known as: LIPITOR Take 1 tablet (80 mg total) by mouth daily.   cholecalciferol 25 MCG (1000 UNIT) tablet Commonly known as: VITAMIN D3 Take 1,000 Units by mouth daily.   clotrimazole 1 % cream Commonly known as: Clotrimazole AF Apply to affected area BID.   losartan-hydrochlorothiazide 100-25 MG tablet Commonly known as: HYZAAR Take 1 tablet by mouth daily. Started by: Felix Pacini        All past medical history, surgical history, allergies, family history, immunizations andmedications were updated in the EMR today and reviewed under the history  and medication portions of their EMR.     Recent Results (from the past 2160 hour(s))  POCT HgB A1C     Status: Abnormal   Collection Time: 09/11/23  8:21 AM  Result Value Ref Range   Hemoglobin A1C 5.8 (A) 4.0 - 5.6 %   HbA1c POC (<> result, manual entry) 5.8 4.0 - 5.6 %   HbA1c, POC (prediabetic range) 5.8 5.7 - 6.4 %   HbA1c, POC (controlled diabetic range) 5.8 0.0 - 7.0 %     ROS: 14 pt review of systems performed and negative (unless mentioned in an HPI)  Objective: BP (!) 170/92   Pulse 79   Wt 154 lb 6.4 oz (70 kg)   SpO2 98%   BMI 32.27 kg/m  Physical Exam Vitals and nursing note reviewed.  Constitutional:      General: She is not in acute distress.    Appearance: Normal appearance. She is not ill-appearing, toxic-appearing or diaphoretic.  HENT:     Head: Normocephalic and atraumatic.  Eyes:     General: No scleral icterus.       Right eye: No discharge.        Left eye: No discharge.     Extraocular Movements: Extraocular movements intact.     Conjunctiva/sclera: Conjunctivae normal.     Pupils: Pupils are equal, round, and reactive to light.  Cardiovascular:     Rate and Rhythm: Normal rate and regular rhythm.     Heart sounds: No murmur heard. Pulmonary:     Effort: Pulmonary effort is normal. No respiratory distress.     Breath sounds: Normal breath sounds. No wheezing, rhonchi or rales.  Musculoskeletal:     Right lower leg: No edema.     Left lower leg: No edema.  Skin:    General: Skin is warm.     Findings: No rash.  Neurological:     Mental Status: She is alert and oriented to person, place, and time. Mental status is at baseline.     Motor: No weakness.     Gait: Gait normal.  Psychiatric:        Mood and Affect: Mood normal.        Behavior: Behavior normal.        Thought Content: Thought content normal.        Judgment: Judgment normal.    Diabetic Foot Exam - Simple   Simple Foot Form Diabetic Foot exam was performed with the  following findings: Yes 09/11/2023  8:23 AM  Visual Inspection No deformities, no ulcerations, no other skin breakdown bilaterally: Yes Sensation Testing Intact to touch and monofilament testing bilaterally: Yes Pulse Check Posterior Tibialis and Dorsalis pulse intact bilaterally: Yes Comments     No results found.  Assessment/plan:  Jiah Hade is a 67 y.o. female present for chronic condition management Essential hypertension/HLD/obesity/noncompliance with med regimen.  Would likely be in range if she had increased to 1.5 tabs (she forgot) stop  lisinopril-hctz 20-12.5mg  qd to 1.5 tabs Start losartan 100-hydrochlorothiazide 25 mg every day (maybe one tab a day will help with compliance) Low sodium, heart healthy diet.  Routine exercise Continue atorvastatin 40 mg nightly Home blood pressure 142/78  Prediabetes -Has been doing great watching her diet and losing weight. -Last A1c was 6.2.> 6.1< 5.8> 5.8 a1c collected today  Vitamin D deficiency/Osteopenia of multiple sites - dexa > due 2023-2025> [place] -Vitamin D levels up-to-date 09/2022   Return in about 12 weeks (around 12/04/2023) for cpe (20 min), Routine chronic condition follow-up.  Orders Placed This Encounter  Procedures   POCT HgB A1C   Meds ordered this encounter  Medications   atorvastatin (LIPITOR) 80 MG tablet    Sig: Take 1 tablet (80 mg total) by mouth daily.    Dispense:  90 tablet    Refill:  3   losartan-hydrochlorothiazide (HYZAAR) 100-25 MG tablet    Sig: Take 1 tablet by mouth daily.    Dispense:  90 tablet    Refill:  1   Referral Orders  No referral(s) requested today     Electronically signed by: Felix Pacini, DO [facility] [place]

## 2023-10-02 ENCOUNTER — Encounter: Payer: Self-pay | Admitting: Family Medicine

## 2023-11-18 ENCOUNTER — Encounter: Payer: PPO | Admitting: Family Medicine

## 2023-12-18 ENCOUNTER — Encounter: Payer: Self-pay | Admitting: Family Medicine

## 2023-12-18 ENCOUNTER — Ambulatory Visit: Payer: Medicare HMO | Admitting: Family Medicine

## 2023-12-18 VITALS — BP 144/80 | HR 68 | Temp 98.3°F | Ht 60.0 in | Wt 154.0 lb

## 2023-12-18 DIAGNOSIS — E785 Hyperlipidemia, unspecified: Secondary | ICD-10-CM | POA: Diagnosis not present

## 2023-12-18 DIAGNOSIS — Z1231 Encounter for screening mammogram for malignant neoplasm of breast: Secondary | ICD-10-CM

## 2023-12-18 DIAGNOSIS — E669 Obesity, unspecified: Secondary | ICD-10-CM

## 2023-12-18 DIAGNOSIS — R7303 Prediabetes: Secondary | ICD-10-CM | POA: Diagnosis not present

## 2023-12-18 DIAGNOSIS — I10 Essential (primary) hypertension: Secondary | ICD-10-CM

## 2023-12-18 DIAGNOSIS — Z0000 Encounter for general adult medical examination without abnormal findings: Secondary | ICD-10-CM | POA: Diagnosis not present

## 2023-12-18 DIAGNOSIS — Z1211 Encounter for screening for malignant neoplasm of colon: Secondary | ICD-10-CM | POA: Diagnosis not present

## 2023-12-18 DIAGNOSIS — Z131 Encounter for screening for diabetes mellitus: Secondary | ICD-10-CM

## 2023-12-18 DIAGNOSIS — M8589 Other specified disorders of bone density and structure, multiple sites: Secondary | ICD-10-CM

## 2023-12-18 DIAGNOSIS — Z13220 Encounter for screening for lipoid disorders: Secondary | ICD-10-CM | POA: Diagnosis not present

## 2023-12-18 DIAGNOSIS — E559 Vitamin D deficiency, unspecified: Secondary | ICD-10-CM | POA: Diagnosis not present

## 2023-12-18 LAB — COMPREHENSIVE METABOLIC PANEL
ALT: 18 U/L (ref 0–35)
AST: 17 U/L (ref 0–37)
Albumin: 4.6 g/dL (ref 3.5–5.2)
Alkaline Phosphatase: 59 U/L (ref 39–117)
BUN: 16 mg/dL (ref 6–23)
CO2: 30 meq/L (ref 19–32)
Calcium: 9.7 mg/dL (ref 8.4–10.5)
Chloride: 98 meq/L (ref 96–112)
Creatinine, Ser: 0.65 mg/dL (ref 0.40–1.20)
GFR: 90.4 mL/min (ref >60.00)
Glucose, Bld: 103 mg/dL — ABNORMAL HIGH (ref 70–99)
Potassium: 4.5 meq/L (ref 3.5–5.1)
Sodium: 135 meq/L (ref 135–145)
Total Bilirubin: 0.5 mg/dL (ref 0.2–1.2)
Total Protein: 7.5 g/dL (ref 6.0–8.3)

## 2023-12-18 LAB — CBC
HCT: 40.6 % (ref 36.0–46.0)
Hemoglobin: 13.4 g/dL (ref 12.0–15.0)
MCHC: 33 g/dL (ref 30.0–36.0)
MCV: 89.9 fL (ref 78.0–100.0)
Platelets: 255 10*3/uL (ref 150.0–400.0)
RBC: 4.51 Mil/uL (ref 3.87–5.11)
RDW: 12.5 % (ref 11.5–15.5)
WBC: 7.1 10*3/uL (ref 4.0–10.5)

## 2023-12-18 LAB — LIPID PANEL
Cholesterol: 279 mg/dL — ABNORMAL HIGH (ref 0–200)
HDL: 55.1 mg/dL (ref >39.00)
LDL Cholesterol: 185 mg/dL — ABNORMAL HIGH (ref 0–99)
NonHDL: 223.49
Total CHOL/HDL Ratio: 5
Triglycerides: 193 mg/dL — ABNORMAL HIGH (ref 0.0–149.0)
VLDL: 38.6 mg/dL (ref 0.0–40.0)

## 2023-12-18 LAB — VITAMIN D 25 HYDROXY (VIT D DEFICIENCY, FRACTURES): VITD: 49.56 ng/mL (ref 30.00–100.00)

## 2023-12-18 LAB — HEMOGLOBIN A1C: Hgb A1c MFr Bld: 6.1 % (ref 4.6–6.5)

## 2023-12-18 LAB — TSH: TSH: 1.34 u[IU]/mL (ref 0.35–5.50)

## 2023-12-18 MED ORDER — AMLODIPINE BESYLATE 2.5 MG PO TABS: 2.5000 mg | ORAL_TABLET | Freq: Every day | ORAL | 1 refills | Status: DC

## 2023-12-18 NOTE — Progress Notes (Signed)
Patient ID: Gloria [name], female  DOB: 1956-04-20, 68 y.o.   MRN: [id number] Patient Care Team    Relationship Specialty Notifications Start End  Natalia Leatherwood, DO PCP - General Family Medicine  08/20/15   Genia Del, MD Consulting Physician Obstetrics and Gynecology  09/24/18   Specialists, [facility]ive [facility]  Gastroenterology  09/30/21     Chief Complaint  Patient presents with   Annual Exam    Pt is fasting.     Subjective: Gloria [name] is a 68 y.o.  Female  present for cpe and Chronic Conditions/illness Management All past medical history, surgical history, allergies, family history, immunizations, medications and social history were updated in the electronic medical record today. All recent labs, ED visits and hospitalizations within the last year were reviewed.  Health maintenance:  Colonoscopy: no fhx. + cologuard 11/2020> [facility]oscopy at [facility]- 5 yr f/u per pt Mammogram: no fhx, completed: 09/2021 > has been ordered- encouraged her to schedule --  [facility] > ordered again Immunizations: tdap UTD11/2019, Influenza completed(encouraged yearly), Shingrix series completed,Prevnar20 declined Infectious disease screening: HIV and  Hep C completed DEXA: 01/07/2019, -1.4 osteopenia, ordered Patient has a Dental home. Hospitalizations/ED visits: reviewed  Hypertension/HLD/obesity:  Pt reports compliance w/ losartan -HCTZ 100-25   Patient denies chest pain, shortness of breath, dizziness or lower extremity edema.  Pt takes a daily baby ASA.  She is prescribed statin.  Diet: Tries to follow a low-salt, low saturated, low sugar diet. Exercise: Does not exercise routinely RF: Hypertension, hyperlipidemia, obesity, history of heart disease in her father.   Diabetes: She has modified her diet, she cut back on sweets.  She has lost weight.   A1c has been in the prediabetic range and not required medication.     12/18/2023    8:11 AM 07/29/2023     3:49 PM 07/18/2022    1:13 PM 04/07/2022    9:07 AM 09/30/2021    8:21 AM  Depression screen PHQ 2/9  Decreased Interest 0 0 0 0 0  Down, Depressed, Hopeless 0 0 0 0 0  PHQ - 2 Score 0 0 0 0 0  Altered sleeping 1      Tired, decreased energy 0      Change in appetite 0      Feeling bad or failure about yourself  0      Trouble concentrating 0      Moving slowly or fidgety/restless 0      Suicidal thoughts 0      PHQ-9 Score 1      Difficult doing work/chores Not difficult at all          12/18/2023    8:12 AM  GAD 7 : Generalized Anxiety Score  Nervous, Anxious, on Edge 0  Control/stop worrying 0  Worry too much - different things 0  Trouble relaxing 0  Restless 0  Easily annoyed or irritable 0  Afraid - awful might happen 0  Total GAD 7 Score 0  Anxiety Difficulty Not difficult at all    Immunization History  Administered Date(s) Administered   Influenza,inj,quad, With Preservative 08/24/2019   Influenza-Unspecified 10/03/2014, 09/04/2015, 08/17/2018, 08/04/2023   Td 11/04/2007   Tdap 09/24/2018   Zoster Recombinant(Shingrix) 09/24/2018, 12/06/2018    Past Medical History:  Diagnosis Date   Allergy    High cholesterol    Hypertension    Nipple dermatitis 01/18/2016   Positive colorectal cancer screening using Cologuard test 11/15/2020  colonoscopy after at [facility]- 5 yr f/u   Seasonal allergies    No Known Allergies Past Surgical History:  Procedure Laterality Date   INNER EAR SURGERY     NO PAST SURGERIES     Family History  Problem Relation Age of Onset   Heart disease Father    Alcohol abuse Brother    Kidney disease Brother    Breast cancer Neg Hx    Social History   Social History Narrative   Gloria [name] is married.  She lives with husband & adopted daughter. She works FT at [employer].   Smoke detector in the home, wears her seatbelt.     Allergies as of 12/18/2023   No Known Allergies      Medication List         Accurate as of December 18, 2023  8:39 AM. If you have any questions, ask your nurse or doctor.          amLODipine 2.5 MG tablet Commonly known as: NORVASC Take 1 tablet (2.5 mg total) by mouth daily. Started by: Felix Pacini   aspirin 81 MG chewable tablet Chew by mouth daily. One tablet every other day   atorvastatin 80 MG tablet Commonly known as: LIPITOR Take 1 tablet (80 mg total) by mouth daily.   cholecalciferol 25 MCG (1000 UNIT) tablet Commonly known as: VITAMIN D3 Take 1,000 Units by mouth daily.   clotrimazole 1 % cream Commonly known as: Clotrimazole AF Apply to affected area BID.   losartan-hydrochlorothiazide 100-25 MG tablet Commonly known as: HYZAAR Take 1 tablet by mouth daily.        All past medical history, surgical history, allergies, family history, immunizations andmedications were updated in the EMR today and reviewed under the history and medication portions of their EMR.     No results found for this or any previous visit (from the past 2160 hours).    ROS: 14 pt review of systems performed and negative (unless mentioned in an HPI)  Objective: BP (!) 144/80   Pulse 68   Temp 98.3 F (36.8 C)   Ht 5' (1.524 m)   Wt 154 lb (69.9 kg)   SpO2 98%   BMI 30.08 kg/m  Physical Exam Vitals and nursing note reviewed.  Constitutional:      General: She is not in acute distress.    Appearance: Normal appearance. She is not ill-appearing, toxic-appearing or diaphoretic.  HENT:     Head: Normocephalic and atraumatic.     Right Ear: Tympanic membrane, ear canal and external ear normal. There is no impacted cerumen.     Left Ear: Tympanic membrane, ear canal and external ear normal. There is no impacted cerumen.     Nose: No congestion or rhinorrhea.     Mouth/Throat:     Mouth: Mucous membranes are moist.     Pharynx: Oropharynx is clear. No oropharyngeal exudate or posterior oropharyngeal erythema.  Eyes:     General: No scleral  icterus.       Right eye: No discharge.        Left eye: No discharge.     Extraocular Movements: Extraocular movements intact.     Conjunctiva/sclera: Conjunctivae normal.     Pupils: Pupils are equal, round, and reactive to light.  Cardiovascular:     Rate and Rhythm: Normal rate and regular rhythm.     Pulses: Normal pulses.     Heart sounds: Normal heart sounds. No murmur heard.  No friction rub. No gallop.  Pulmonary:     Effort: Pulmonary effort is normal. No respiratory distress.     Breath sounds: Normal breath sounds. No stridor. No wheezing, rhonchi or rales.  Chest:     Chest wall: No tenderness.  Abdominal:     General: Abdomen is flat. Bowel sounds are normal. There is no distension.     Palpations: Abdomen is soft. There is no mass.     Tenderness: There is no abdominal tenderness. There is no right CVA tenderness, left CVA tenderness, guarding or rebound.     Hernia: No hernia is present.  Musculoskeletal:        General: No swelling, tenderness or deformity. Normal range of motion.     Cervical back: Normal range of motion and neck supple. No rigidity or tenderness.     Right lower leg: No edema.     Left lower leg: No edema.  Lymphadenopathy:     Cervical: No cervical adenopathy.  Skin:    General: Skin is warm and dry.     Coloration: Skin is not jaundiced or pale.     Findings: No bruising, erythema, lesion or rash.  Neurological:     General: No focal deficit present.     Mental Status: She is alert and oriented to person, place, and time. Mental status is at baseline.     Cranial Nerves: No cranial nerve deficit.     Sensory: No sensory deficit.     Motor: No weakness.     Coordination: Coordination normal.     Gait: Gait normal.     Deep Tendon Reflexes: Reflexes normal.  Psychiatric:        Mood and Affect: Mood normal.        Behavior: Behavior normal.        Thought Content: Thought content normal.        Judgment: Judgment normal.     No  results found.  Assessment/plan: Gloria [name] is a 68 y.o. female present for CPE and chronic condition management Essential hypertension/HLD/obesity/noncompliance with med regimen.  Goal < 130/80 Continue  losartan 100-hydrochlorothiazide 25 mg every day  Added amlodipine 2.5 mg qd Low sodium, heart healthy diet.  Routine exercise continue atorvastatin 40 mg nightly Cbc, cmp, tsh, lipids collected  Prediabetes -Has been doing great watching her diet and losing weight. -Last A1c was 6.2.> 6.1< 5.8> 5.8 5.8>A1c collected today  Vitamin D deficiency/Osteopenia of multiple sites - dexa > ordered bc with mam -Vitamin D levels collected today  Breast cancer screening by mammogram - MM 3D SCREENING MAMMOGRAM BILATERAL BREAST; Future  Routine general medical examination at a health care facility (Primary) Patient was encouraged to exercise greater than 150 minutes a week. Patient was encouraged to choose a diet filled with fresh fruits and vegetables, and lean meats. AVS provided to patient today for education/recommendation on gender specific health and safety maintenance. Colonoscopy: no fhx. + cologuard 11/2020> [facility]oscopy at [facility] 03/2021- 5 yr f/u per pt Mammogram: no fhx, completed: 09/2021 > has been ordered- encouraged her to schedule --  Breast center > ordered again Immunizations: tdap UTD11/2019, Influenza completed(encouraged yearly), Shingrix series completed,Prevnar20 declined Infectious disease screening: HIV and  Hep C completed DEXA: 01/07/2019, -1.4 osteopenia, ordered  No follow-ups on file.  Orders Placed This Encounter  Procedures   MM 3D SCREENING MAMMOGRAM BILATERAL BREAST   DG Bone Density   CBC   Comprehensive metabolic panel   Hemoglobin A1c  Lipid panel   TSH   Vitamin D (25 hydroxy)   Meds ordered this encounter  Medications   amLODipine (NORVASC) 2.5 MG tablet    Sig: Take 1 tablet (2.5 mg total) by mouth daily.    Dispense:   90 tablet    Refill:  1   Referral Orders  No referral(s) requested today     Electronically signed by: Felix Pacini, DO  [facility] [place]

## 2023-12-18 NOTE — Patient Instructions (Signed)

## 2024-03-07 DIAGNOSIS — H52222 Regular astigmatism, left eye: Secondary | ICD-10-CM | POA: Diagnosis not present

## 2024-03-07 DIAGNOSIS — H5203 Hypermetropia, bilateral: Secondary | ICD-10-CM | POA: Diagnosis not present

## 2024-03-07 DIAGNOSIS — H25813 Combined forms of age-related cataract, bilateral: Secondary | ICD-10-CM | POA: Diagnosis not present

## 2024-03-07 DIAGNOSIS — H35373 Puckering of macula, bilateral: Secondary | ICD-10-CM | POA: Diagnosis not present

## 2024-03-07 DIAGNOSIS — H524 Presbyopia: Secondary | ICD-10-CM | POA: Diagnosis not present

## 2024-03-07 DIAGNOSIS — H33312 Horseshoe tear of retina without detachment, left eye: Secondary | ICD-10-CM | POA: Diagnosis not present

## 2024-03-11 DIAGNOSIS — H524 Presbyopia: Secondary | ICD-10-CM | POA: Diagnosis not present

## 2024-06-03 ENCOUNTER — Telehealth: Payer: Self-pay

## 2024-06-03 MED ORDER — AMLODIPINE BESYLATE 2.5 MG PO TABS: 2.5000 mg | ORAL_TABLET | Freq: Every day | ORAL | 0 refills | Status: DC

## 2024-06-03 MED ORDER — LOSARTAN POTASSIUM-HCTZ 100-25 MG PO TABS
1.0000 | ORAL_TABLET | Freq: Every day | ORAL | 0 refills | Status: DC
Start: 2024-06-03 — End: 2024-06-29

## 2024-06-03 NOTE — Telephone Encounter (Signed)
 Communication  Reason for CRM: Patient is calling in regarding her medications, patient is confused on what medications she should be taking, because she thought she was still suppose to be taking the lisinopril  but the pharmacy did not have it and it is not on the medication list anymore. Please advise the patient.    LVM to discuss.

## 2024-06-03 NOTE — Addendum Note (Signed)
 Addended by: GEORGEAN BEEN A on: 06/03/2024 02:07 PM   Modules accepted: Orders

## 2024-06-29 ENCOUNTER — Ambulatory Visit: Payer: Self-pay

## 2024-06-29 ENCOUNTER — Encounter: Payer: Self-pay | Admitting: Family Medicine

## 2024-06-29 ENCOUNTER — Ambulatory Visit (INDEPENDENT_AMBULATORY_CARE_PROVIDER_SITE_OTHER): Admitting: Family Medicine

## 2024-06-29 VITALS — BP 140/72 | HR 70 | Temp 98.0°F | Wt 155.4 lb

## 2024-06-29 DIAGNOSIS — K2900 Acute gastritis without bleeding: Secondary | ICD-10-CM | POA: Diagnosis not present

## 2024-06-29 DIAGNOSIS — R112 Nausea with vomiting, unspecified: Secondary | ICD-10-CM | POA: Diagnosis not present

## 2024-06-29 MED ORDER — AMLODIPINE BESYLATE 2.5 MG PO TABS: 2.5000 mg | ORAL_TABLET | Freq: Every day | ORAL | 0 refills | Status: DC

## 2024-06-29 MED ORDER — LOSARTAN POTASSIUM-HCTZ 100-25 MG PO TABS: 1.0000 | ORAL_TABLET | Freq: Every day | ORAL | 0 refills | Status: DC

## 2024-06-29 MED ORDER — TRIAMCINOLONE ACETONIDE 0.1 % EX CREA
1.0000 | TOPICAL_CREAM | Freq: Two times a day (BID) | CUTANEOUS | 3 refills | Status: AC
Start: 2024-06-29 — End: ?

## 2024-06-29 MED ORDER — ONDANSETRON 4 MG PO TBDP: 4.0000 mg | ORAL_TABLET | Freq: Three times a day (TID) | ORAL | 0 refills | Status: DC | PRN

## 2024-06-29 NOTE — Patient Instructions (Signed)

## 2024-06-29 NOTE — Telephone Encounter (Signed)
 FYI Only or Action Required?: FYI only for provider.  Patient was last seen in primary care on 12/18/2023 by Catherine Fuller A, DO.  Called Nurse Triage reporting Vomiting.  Symptoms began yesterday.  Interventions attempted: Nothing.  Symptoms are: unchanged.  Triage Disposition: See Physician Within 24 Hours  Patient/caregiver understands and will follow disposition?: Yes             Copied from CRM #[id number]. Topic: Clinical - Red Word Triage >> Jun 29, 2024  8:13 AM Robinson H wrote: [facility] that prompted transfer to Nurse Triage: Vomiting, headache so bad she couldn't think, bad fatigue, was recently switched to losartan -hydrochlorothiazide  (HYZAAR) 100-25 MG tablet. Reason for Disposition  [1] MILD or MODERATE vomiting AND [2] present > 48 hours (2 days)  (Exception: Mild vomiting with associated diarrhea.)  Answer Assessment - Initial Assessment Questions 1. VOMITING SEVERITY: How many times have you vomited in the past 24 hours?      3 2. ONSET: When did the vomiting begin?      yesterday 3. FLUIDS: What fluids or food have you vomited up today? Have you been able to keep any fluids down?     yes 4. ABDOMEN PAIN: Are your having any abdomen pain? If Yes : How bad is it and what does it feel like? (e.g., crampy, dull, intermittent, constant)      no 5. DIARRHEA: Is there any diarrhea? If Yes, ask: How many times today?      no 6. CONTACTS: Is there anyone else in the family with the same symptoms?      no 7. CAUSE: What do you think is causing your vomiting?     unknown 8. HYDRATION STATUS: Any signs of dehydration? (e.g., dry mouth [not only dry lips], too weak to stand) When did you last urinate?     no 9. OTHER SYMPTOMS: Do you have any other symptoms? (e.g., fever, headache, vertigo, vomiting blood or coffee grounds, recent head injury)     headache 10. PREGNANCY: Is there any chance you are pregnant? When was your last  menstrual period?       na  Protocols used: Vomiting-A-AH

## 2024-06-29 NOTE — Telephone Encounter (Signed)
 noted

## 2024-06-29 NOTE — Progress Notes (Signed)
 Gloria [name] , 12/18/1955, 68 y.o., female MRN: [id number] Patient Care Team    Relationship Specialty Notifications Start End  Catherine Charlies LABOR, DO PCP - General Family Medicine  08/20/15   Lavoie, Marie-Lyne, MD Consulting Physician Obstetrics and Gynecology  09/24/18   Specialists, [facility]  Gastroenterology  09/30/21     Chief Complaint  Patient presents with   Nausea    24 hrs     Subjective: Gloria [name] is a 68 y.o. Pt presents for an OV with complaints of vomiting  of 1 day duration.  Associated symptoms include nausea.  Patient reports she is tolerating p.o.  She able to drink water and replace electrolytes.  Her vomiting started yesterday and came on suddenly while she was at work.  She became nauseous, had a headache and vomited.  She ended up vomiting 3 times yesterday and she has vomited 2 times already today. She denies fever, chills, abdominal pain, diarrhea or recurrent headache.  She denies rash. She denies sick contacts with similar illnesses. Pt has tried a pain to ease their symptoms.      06/29/2024   11:28 AM 12/18/2023    8:11 AM 07/29/2023    3:49 PM 07/18/2022    1:13 PM 04/07/2022    9:07 AM  Depression screen PHQ 2/9  Decreased Interest 0 0 0 0 0  Down, Depressed, Hopeless 0 0 0 0 0  PHQ - 2 Score 0 0 0 0 0  Altered sleeping  1     Tired, decreased energy  0     Change in appetite  0     Feeling bad or failure about yourself   0     Trouble concentrating  0     Moving slowly or fidgety/restless  0     Suicidal thoughts  0     PHQ-9 Score  1     Difficult doing work/chores  Not difficult at all       No Known Allergies Social History   Social History Narrative   Gloria [name] is married.  She lives with husband & adopted daughter. She works FT at [employer].   Smoke detector in the home, wears her seatbelt.    Past Medical History:  Diagnosis Date   Allergy    High cholesterol    Hypertension    Nipple dermatitis  01/18/2016   Positive colorectal cancer screening using Cologuard test 11/15/2020   colonoscopy after at [facility]- 5 yr f/u   Seasonal allergies    Past Surgical History:  Procedure Laterality Date   INNER EAR SURGERY     NO PAST SURGERIES     Family History  Problem Relation Age of Onset   Heart disease Father    Alcohol abuse Brother    Kidney disease Brother    Breast cancer Neg Hx    Allergies as of 06/29/2024   No Known Allergies      Medication List        Accurate as of June 29, 2024 12:59 PM. If you have any questions, ask your nurse or doctor.          STOP taking these medications    clotrimazole  1 % cream Commonly known as: Clotrimazole  AF Stopped by: Charlies Catherine       TAKE these medications    amLODipine  2.5 MG tablet Commonly known as: NORVASC  Take 1 tablet (2.5 mg total) by mouth daily.  aspirin 81 MG chewable tablet Chew by mouth daily. One tablet every other day   atorvastatin  80 MG tablet Commonly known as: LIPITOR Take 1 tablet (80 mg total) by mouth daily.   cholecalciferol 25 MCG (1000 UNIT) tablet Commonly known as: VITAMIN D3 Take 1,000 Units by mouth daily.   losartan -hydrochlorothiazide  100-25 MG tablet Commonly known as: HYZAAR Take 1 tablet by mouth daily.   ondansetron  4 MG disintegrating tablet Commonly known as: ZOFRAN -ODT Take 1 tablet (4 mg total) by mouth every 8 (eight) hours as needed for nausea or vomiting. Started by: Koral Thaden   triamcinolone  cream 0.1 % Commonly known as: KENALOG  Apply 1 Application topically 2 (two) times daily. When needed. Started by: Charlies Bellini        All past medical history, surgical history, allergies, family history, immunizations andmedications were updated in the EMR today and reviewed under the history and medication portions of their EMR.     ROS Negative, with the exception of above mentioned in HPI   Objective:  BP (!) 140/72   Pulse 70   Temp 98 F  (36.7 C)   Wt 155 lb 6.4 oz (70.5 kg)   SpO2 96%   BMI 30.35 kg/m  Body mass index is 30.35 kg/m. Physical Exam Vitals and nursing note reviewed.  Constitutional:      General: She is not in acute distress.    Appearance: Normal appearance. She is normal weight. She is not ill-appearing, toxic-appearing or diaphoretic.     Comments: Does not appear ill or uncomfortable  HENT:     Head: Normocephalic and atraumatic.  Eyes:     General: No scleral icterus.       Right eye: No discharge.        Left eye: No discharge.     Extraocular Movements: Extraocular movements intact.     Conjunctiva/sclera: Conjunctivae normal.     Pupils: Pupils are equal, round, and reactive to light.  Cardiovascular:     Rate and Rhythm: Normal rate and regular rhythm.  Pulmonary:     Effort: Pulmonary effort is normal.     Breath sounds: Normal breath sounds.  Abdominal:     General: Abdomen is flat. Bowel sounds are normal. There is no distension.     Palpations: Abdomen is soft.     Tenderness: There is no abdominal tenderness. There is no guarding or rebound.  Skin:    Findings: No rash.     Comments: Right nipple scaling and dry.  Chronic condition  Neurological:     Mental Status: She is alert and oriented to person, place, and time. Mental status is at baseline.     Motor: No weakness.     Coordination: Coordination normal.     Gait: Gait normal.  Psychiatric:        Mood and Affect: Mood normal.        Behavior: Behavior normal.        Thought Content: Thought content normal.        Judgment: Judgment normal.      No results found. No results found. No results found for this or any previous visit (from the past 24 hours).  Assessment/Plan: Gloria [name] is a 68 y.o. female present for OV for  Viral gastritis Gastritis possibly a viral cause.  She does not feel she ate anything prior to onset of symptoms to cause food poisoning. Rest. Hydrate with water and electrolytes Avoid  NSAIDs IM zofran -declined Zofran  ODT  prescription provided Take an ORS (oral rehydration solution). This is a drink that is sold at pharmacies and stores. Drink clear fluids in small amounts as you are able, such as: Water. Ice chips. Fruit juice that has water added (diluted fruit juice). Low-calorie sports drinks. Eat bland, easy-to-digest foods in small amounts as you are able, such as: Bananas. Applesauce. Rice. Low-fat (lean) meats. Toast. Crackers. Avoid drinking fluids that have a lot of sugar or caffeine in them. This includes energy drinks, sports drinks, and soda. Avoid dairy products Avoid alcohol. Avoid spicy or fatty foods.  -Patient was encouraged to follow-up in 2 weeks before leaving with the lipids for her chronic conditions appointment.  We can reevaluate her nausea and vomiting at that time.  If nausea and vomiting does not improve, would encourage her to follow-up sooner or seek urgent care.  Eczema-areola Has been worked up in the past, chronic condition.  Clotrimazole  cream reportedly not working. Triamcinolone  cream twice daily prescribed  Reviewed expectations re: course of current medical issues. Discussed self-management of symptoms. Outlined signs and symptoms indicating need for more acute intervention. Patient verbalized understanding and all questions were answered. Patient received an After-Visit Summary.    No orders of the defined types were placed in this encounter.  Meds ordered this encounter  Medications   ondansetron  (ZOFRAN -ODT) 4 MG disintegrating tablet    Sig: Take 1 tablet (4 mg total) by mouth every 8 (eight) hours as needed for nausea or vomiting.    Dispense:  20 tablet    Refill:  0   losartan -hydrochlorothiazide  (HYZAAR) 100-25 MG tablet    Sig: Take 1 tablet by mouth daily.    Dispense:  30 tablet    Refill:  0   amLODipine  (NORVASC ) 2.5 MG tablet    Sig: Take 1 tablet (2.5 mg total) by mouth daily.    Dispense:  30  tablet    Refill:  0   triamcinolone  cream (KENALOG ) 0.1 %    Sig: Apply 1 Application topically 2 (two) times daily. When needed.    Dispense:  45 g    Refill:  3   Referral Orders  No referral(s) requested today     Note is dictated utilizing voice recognition software. Although note has been proof read prior to signing, occasional typographical errors still can be missed. If any questions arise, please do not hesitate to call for verification.   electronically signed by:  Charlies Bellini, DO  [facility] [place]

## 2024-09-15 DIAGNOSIS — E785 Hyperlipidemia, unspecified: Secondary | ICD-10-CM | POA: Diagnosis not present

## 2024-09-15 DIAGNOSIS — Z7982 Long term (current) use of aspirin: Secondary | ICD-10-CM | POA: Diagnosis not present

## 2024-09-15 DIAGNOSIS — M8580 Other specified disorders of bone density and structure, unspecified site: Secondary | ICD-10-CM | POA: Diagnosis not present

## 2024-09-15 DIAGNOSIS — Z8249 Family history of ischemic heart disease and other diseases of the circulatory system: Secondary | ICD-10-CM | POA: Diagnosis not present

## 2024-09-15 DIAGNOSIS — Z6832 Body mass index (BMI) 32.0-32.9, adult: Secondary | ICD-10-CM | POA: Diagnosis not present

## 2024-09-15 DIAGNOSIS — I10 Essential (primary) hypertension: Secondary | ICD-10-CM | POA: Diagnosis not present

## 2024-09-15 DIAGNOSIS — E669 Obesity, unspecified: Secondary | ICD-10-CM | POA: Diagnosis not present

## 2024-09-20 ENCOUNTER — Other Ambulatory Visit: Payer: Self-pay | Admitting: Family Medicine

## 2024-10-05 ENCOUNTER — Ambulatory Visit: Admitting: *Deleted

## 2024-10-05 VITALS — Ht 59.0 in | Wt 155.0 lb

## 2024-10-05 DIAGNOSIS — Z0000 Encounter for general adult medical examination without abnormal findings: Secondary | ICD-10-CM

## 2024-10-05 NOTE — Progress Notes (Signed)
 Chief Complaint  Patient presents with   Medicare Wellness     Subjective:   Gloria [name] is a 68 y.o. female who presents for a Medicare Annual Wellness Visit.  I connected with  Signa Cheek on 10/05/24 by a audio enabled telemedicine application and verified that I am speaking with the correct person using two identifiers.  Patient Location: Home  Provider Location: Home Office  Persons Participating in Visit: Patient.  I discussed the limitations of evaluation and management by telemedicine. The patient expressed understanding and agreed to proceed.   Vital Signs: Because this visit was a virtual/telehealth visit, some criteria may be missing or patient reported. Any vitals not documented were not able to be obtained and vitals that have been documented are patient reported.      Visit info / Clinical Intake: Medicare Wellness Visit Type:: Subsequent Annual Wellness Visit Persons participating in visit and providing information:: patient Medicare Wellness Visit Mode:: Telephone If telephone:: video declined Since this visit was completed virtually, some vitals may be partially provided or unavailable. Missing vitals are due to the limitations of the virtual format.: Unable to obtain vitals - no equipment If Telephone or Video please confirm:: I connected with patient using audio/video enable telemedicine. I verified patient identity with two identifiers, discussed telehealth limitations, and patient agreed to proceed. Patient Location:: home Provider Location:: home Interpreter Needed?: No Pre-visit prep was completed: no AWV questionnaire completed by patient prior to visit?: no Living arrangements:: lives with spouse/significant other Patient's Overall Health Status Rating: good Typical amount of pain: none Does pain affect daily life?: no Are you currently prescribed opioids?: no  Dietary Habits and Nutritional Risks How many meals a day?: 2 Eats fruit and  vegetables daily?: yes Most meals are obtained by: preparing own meals In the last 2 weeks, have you had any of the following?: none Diabetic:: no  Functional Status Activities of Daily Living (to include ambulation/medication): Independent Ambulation: Independent Medication Administration: Independent Home Management (perform basic housework or laundry): Independent Manage your own finances?: (!) no Primary transportation is: driving Concerns about vision?: no *vision screening is required for WTM* Concerns about hearing?: no  Fall Screening Falls in the past year?: 0 Number of falls in past year: 0 Was there an injury with Fall?: 0 Fall Risk Category Calculator: 0 Patient Fall Risk Level: Low Fall Risk  Fall Risk Patient at Risk for Falls Due to: No Fall Risks Fall risk Follow up: Falls evaluation completed; Education provided; Falls prevention discussed  Home and Transportation Safety: All rugs have non-skid backing?: yes All stairs or steps have railings?: N/A, no stairs Grab bars in the bathtub or shower?: (!) no Have non-skid surface in bathtub or shower?: yes Good home lighting?: yes Regular seat belt use?: yes Hospital stays in the last year:: no  Cognitive Assessment Difficulty concentrating, remembering, or making decisions? : no Will 6CIT or Mini Cog be Completed: yes What year is it?: 0 points What month is it?: 0 points Give patient an address phrase to remember (5 components): its very sunny outside today in December About what time is it?: 0 points Count backwards from 20 to 1: 0 points Say the months of the year in reverse: 0 points Repeat the address phrase from earlier: 0 points 6 CIT Score: 0 points  Advance Directives (For Healthcare) Does Patient Have a Medical Advance Directive?: Yes Does patient want to make changes to medical advance directive?: No - Patient declined Type of Advance Directive:  Healthcare Power of [employer] of Healthcare  Power of Attorney in Chart?: No - copy requested  Reviewed/Updated  Reviewed/Updated: Reviewed All (Medical, Surgical, Family, Medications, Allergies, Care Teams, Patient Goals); Surgical History; Family History; Medications; Allergies; Care Teams; Patient Goals; Medical History    Allergies (verified) Patient has no known allergies.   Current Medications (verified) Outpatient Encounter Medications as of 10/05/2024  Medication Sig   amLODipine  (NORVASC ) 2.5 MG tablet Take 1 tablet (2.5 mg total) by mouth daily.   aspirin 81 MG chewable tablet Chew by mouth daily. One tablet every other day   atorvastatin  (LIPITOR) 80 MG tablet Take 1 tablet (80 mg total) by mouth daily.   cholecalciferol (VITAMIN D3) 25 MCG (1000 UT) tablet Take 1,000 Units by mouth daily.   losartan -hydrochlorothiazide  (HYZAAR) 100-25 MG tablet Take 1 tablet by mouth daily.   ondansetron  (ZOFRAN -ODT) 4 MG disintegrating tablet Take 1 tablet (4 mg total) by mouth every 8 (eight) hours as needed for nausea or vomiting.   triamcinolone  cream (KENALOG ) 0.1 % Apply 1 Application topically 2 (two) times daily. When needed.   No facility-administered encounter medications on file as of 10/05/2024.    History: Past Medical History:  Diagnosis Date   Allergy    High cholesterol    Hypertension    Nipple dermatitis 01/18/2016   Positive colorectal cancer screening using Cologuard test 11/15/2020   colonoscopy after at [facility]- 5 yr f/u   Seasonal allergies    Past Surgical History:  Procedure Laterality Date   INNER EAR SURGERY     NO PAST SURGERIES     Family History  Problem Relation Age of Onset   Heart disease Father    Alcohol abuse Brother    Kidney disease Brother    Breast cancer Neg Hx    Social History   Occupational History   Occupation: Scientist, Research (medical): [employer]    Comment: [employer]  Tobacco Use   Smoking status: Never   Smokeless tobacco: Never  Vaping Use   Vaping status:  Never Used  Substance and Sexual Activity   Alcohol use: No    Alcohol/week: 0.0 standard drinks of alcohol   Drug use: No   Sexual activity: Yes    Birth control/protection: Post-menopausal   Tobacco Counseling Counseling given: Not Answered  SDOH Screenings   Food Insecurity: No Food Insecurity (10/05/2024)  Housing: Unknown (10/05/2024)  Transportation Needs: No Transportation Needs (10/05/2024)  Utilities: Not At Risk (10/05/2024)  Alcohol Screen: Low Risk  (07/29/2023)  Depression (PHQ2-9): Low Risk  (10/05/2024)  Financial Resource Strain: Low Risk  (07/29/2023)  Physical Activity: Insufficiently Active (10/05/2024)  Social Connections: Moderately Integrated (10/05/2024)  Stress: No Stress Concern Present (10/05/2024)  Tobacco Use: Low Risk  (10/05/2024)  Health Literacy: Adequate Health Literacy (10/05/2024)   See flowsheets for full screening details  Depression Screen PHQ 2 & 9 Depression Scale- Over the past 2 weeks, how often have you been bothered by any of the following problems? Little interest or pleasure in doing things: 0 Feeling down, depressed, or hopeless (PHQ Adolescent also includes...irritable): 0 PHQ-2 Total Score: 0 Trouble falling or staying asleep, or sleeping too much: 0 Feeling tired or having little energy: 0 Poor appetite or overeating (PHQ Adolescent also includes...weight loss): 0 Feeling bad about yourself - or that you are a failure or have let yourself or your family down: 0 Trouble concentrating on things, such as reading the newspaper or watching television (PHQ Adolescent  also includes...like school work): 0 Moving or speaking so slowly that other people could have noticed. Or the opposite - being so fidgety or restless that you have been moving around a lot more than usual: 0 Thoughts that you would be better off dead, or of hurting yourself in some way: 0 PHQ-9 Total Score: 0 If you checked off any problems, how difficult have these problems made  it for you to do your work, take care of things at home, or get along with other people?: Not difficult at all     Goals Addressed             This Visit's Progress    Patient Stated       Continue current lifestyle             Objective:    Today's Vitals   10/05/24 1518  Weight: 155 lb (70.3 kg)  Height: 4' 11 (1.499 m)   Body mass index is 31.31 kg/m.  Hearing/Vision screen Hearing Screening - Comments:: No trouble hearing Vision Screening - Comments:: Up to date Unsure of name Immunizations and Health Maintenance Health Maintenance  Topic Date Due   Pneumococcal Vaccine: 50+ Years (1 of 1 - PCV) Never done   Bone Density Scan  01/07/2024   Influenza Vaccine  01/31/2025 (Originally 06/03/2024)   Mammogram  06/29/2025 (Originally 09/13/2022)   Medicare Annual Wellness (AWV)  10/05/2025   Colonoscopy  03/20/2026   DTaP/Tdap/Td (3 - Td or Tdap) 09/24/2028   Hepatitis C Screening  Completed   Zoster Vaccines- Shingrix   Completed   Meningococcal B Vaccine  Aged Out   COVID-19 Vaccine  Discontinued   Fecal DNA (Cologuard)  Discontinued        Assessment/Plan:  This is a routine wellness examination for Gloria [name].  Patient Care Team: Catherine Charlies LABOR, DO as PCP - General (Family Medicine) Specialists, Digestive Health (Gastroenterology)  I have personally reviewed and noted the following in the patient's chart:   Medical and social history Use of alcohol, tobacco or illicit drugs  Current medications and supplements including opioid prescriptions. Functional ability and status Nutritional status Physical activity Advanced directives List of other physicians Hospitalizations, surgeries, and ER visits in previous 12 months Vitals Screenings to include cognitive, depression, and falls Referrals and appointments  No orders of the defined types were placed in this encounter.  In addition, I have reviewed and discussed with patient certain preventive  protocols, quality metrics, and best practice recommendations. A written personalized care plan for preventive services as well as general preventive health recommendations were provided to patient.   Noor Witte, LPN   [id number]   Return in 1 year (on 10/05/2025).  After Visit Summary: (MyChart) Due to this being a telephonic visit, the after visit summary with patients personalized plan was offered to patient via MyChart   Nurse Notes:

## 2024-10-05 NOTE — Patient Instructions (Signed)
 Gloria [name],  Thank you for taking the time for your Medicare Wellness Visit. I appreciate your continued commitment to your health goals. Please review the care plan we discussed, and feel free to reach out if I can assist you further.  Please note that Annual Wellness Visits do not include a physical exam. Some assessments may be limited, especially if the visit was conducted virtually. If needed, we may recommend an in-person follow-up with your provider.  Ongoing Care Seeing your primary care provider every 3 to 6 months helps us  monitor your health and provide consistent, personalized care.   Referrals If a referral was made during today's visit and you haven't received any updates within two weeks, please contact the referred provider directly to check on the status.  Recommended Screenings:  Health Maintenance  Topic Date Due   Pneumococcal Vaccine for age over 74 (1 of 1 - PCV) Never done   Osteoporosis screening with Bone Density Scan  01/07/2024   Flu Shot  01/31/2025*   Breast Cancer Screening  06/29/2025*   Medicare Annual Wellness Visit  10/05/2025   Colon Cancer Screening  03/20/2026   DTaP/Tdap/Td vaccine (3 - Td or Tdap) 09/24/2028   Hepatitis C Screening  Completed   Zoster (Shingles) Vaccine  Completed   Meningitis B Vaccine  Aged Out   COVID-19 Vaccine  Discontinued   Cologuard (Stool DNA test)  Discontinued  *Topic was postponed. The date shown is not the original due date.       10/05/2024    3:19 PM  Advanced Directives  Does Patient Have a Medical Advance Directive? Yes  Type of Advance Directive Healthcare Power of Attorney  Does patient want to make changes to medical advance directive? No - Patient declined  Copy of Healthcare Power of Attorney in Chart? No - copy requested    Vision: Annual vision screenings are recommended for early detection of glaucoma, cataracts, and diabetic retinopathy. These exams can also reveal signs of chronic conditions such  as diabetes and high blood pressure.  Dental: Annual dental screenings help detect early signs of oral cancer, gum disease, and other conditions linked to overall health, including heart disease and diabetes.  Please see the attached documents for additional preventive care recommendations.    Gloria [name] , Thank you for taking time to come for your Medicare Wellness Visit. I appreciate your ongoing commitment to your health goals. Please review the following plan we discussed and let me know if I can assist you in the future.   Screening recommendations/referrals: Colonoscopy:  Mammogram:  Bone Density:  Recommended yearly ophthalmology/optometry visit for glaucoma screening and checkup Recommended yearly dental visit for hygiene and checkup  Vaccinations: Influenza vaccine:  Pneumococcal vaccine:  Tdap vaccine:  Shingles vaccine:        Preventive Care 65 Years and Older, Female Preventive care refers to lifestyle choices and visits with your health care provider that can promote health and wellness. What does preventive care include? A yearly physical exam. This is also called an annual well check. Dental exams once or twice a year. Routine eye exams. Ask your health care provider how often you should have your eyes checked. Personal lifestyle choices, including: Daily care of your teeth and gums. Regular physical activity. Eating a healthy diet. Avoiding tobacco and drug use. Limiting alcohol use. Practicing safe sex. Taking low-dose aspirin every day. Taking vitamin and mineral supplements as recommended by your health care provider. What happens during an annual well check?  The services and screenings done by your health care provider during your annual well check will depend on your age, overall health, lifestyle risk factors, and family history of disease. Counseling  Your health care provider may ask you questions about your: Alcohol use. Tobacco use. Drug  use. Emotional well-being. Home and relationship well-being. Sexual activity. Eating habits. History of falls. Memory and ability to understand (cognition). Work and work astronomer. Reproductive health. Screening  You may have the following tests or measurements: Height, weight, and BMI. Blood pressure. Lipid and cholesterol levels. These may be checked every 5 years, or more frequently if you are over 63 years old. Skin check. Lung cancer screening. You may have this screening every year starting at age 37 if you have a 30-pack-year history of smoking and currently smoke or have quit within the past 15 years. Fecal occult blood test (FOBT) of the stool. You may have this test every year starting at age 1. Flexible sigmoidoscopy or colonoscopy. You may have a sigmoidoscopy every 5 years or a colonoscopy every 10 years starting at age 55. Hepatitis C blood test. Hepatitis B blood test. Sexually transmitted disease (STD) testing. Diabetes screening. This is done by checking your blood sugar (glucose) after you have not eaten for a while (fasting). You may have this done every 1-3 years. Bone density scan. This is done to screen for osteoporosis. You may have this done starting at age 3. Mammogram. This may be done every 1-2 years. Talk to your health care provider about how often you should have regular mammograms. Talk with your health care provider about your test results, treatment options, and if necessary, the need for more tests. Vaccines  Your health care provider may recommend certain vaccines, such as: Influenza vaccine. This is recommended every year. Tetanus, diphtheria, and acellular pertussis (Tdap, Td) vaccine. You may need a Td booster every 10 years. Zoster vaccine. You may need this after age 89. Pneumococcal 13-valent conjugate (PCV13) vaccine. One dose is recommended after age 6. Pneumococcal polysaccharide (PPSV23) vaccine. One dose is recommended after age  49. Talk to your health care provider about which screenings and vaccines you need and how often you need them. This information is not intended to replace advice given to you by your health care provider. Make sure you discuss any questions you have with your health care provider. Document Released: 11/16/2015 Document Revised: 07/09/2016 Document Reviewed: 08/21/2015 Elsevier Interactive Patient Education  2017 [employer].  Fall Prevention in the Home Falls can cause injuries. They can happen to people of all ages. There are many things you can do to make your home safe and to help prevent falls. What can I do on the outside of my home? Regularly fix the edges of walkways and driveways and fix any cracks. Remove anything that might make you trip as you walk through a door, such as a raised step or threshold. Trim any bushes or trees on the path to your home. Use bright outdoor lighting. Clear any walking paths of anything that might make someone trip, such as rocks or tools. Regularly check to see if handrails are loose or broken. Make sure that both sides of any steps have handrails. Any raised decks and porches should have guardrails on the edges. Have any leaves, snow, or ice cleared regularly. Use sand or salt on walking paths during winter. Clean up any spills in your garage right away. This includes oil or grease spills. What can I do in the  bathroom? Use night lights. Install grab bars by the toilet and in the tub and shower. Do not use towel bars as grab bars. Use non-skid mats or decals in the tub or shower. If you need to sit down in the shower, use a plastic, non-slip stool. Keep the floor dry. Clean up any water that spills on the floor as soon as it happens. Remove soap buildup in the tub or shower regularly. Attach bath mats securely with double-sided non-slip rug tape. Do not have throw rugs and other things on the floor that can make you trip. What can I do in the  bedroom? Use night lights. Make sure that you have a light by your bed that is easy to reach. Do not use any sheets or blankets that are too big for your bed. They should not hang down onto the floor. Have a firm chair that has side arms. You can use this for support while you get dressed. Do not have throw rugs and other things on the floor that can make you trip. What can I do in the kitchen? Clean up any spills right away. Avoid walking on wet floors. Keep items that you use a lot in easy-to-reach places. If you need to reach something above you, use a strong step stool that has a grab bar. Keep electrical cords out of the way. Do not use floor polish or wax that makes floors slippery. If you must use wax, use non-skid floor wax. Do not have throw rugs and other things on the floor that can make you trip. What can I do with my stairs? Do not leave any items on the stairs. Make sure that there are handrails on both sides of the stairs and use them. Fix handrails that are broken or loose. Make sure that handrails are as long as the stairways. Check any carpeting to make sure that it is firmly attached to the stairs. Fix any carpet that is loose or worn. Avoid having throw rugs at the top or bottom of the stairs. If you do have throw rugs, attach them to the floor with carpet tape. Make sure that you have a light switch at the top of the stairs and the bottom of the stairs. If you do not have them, ask someone to add them for you. What else can I do to help prevent falls? Wear shoes that: Do not have high heels. Have rubber bottoms. Are comfortable and fit you well. Are closed at the toe. Do not wear sandals. If you use a stepladder: Make sure that it is fully opened. Do not climb a closed stepladder. Make sure that both sides of the stepladder are locked into place. Ask someone to hold it for you, if possible. Clearly mark and make sure that you can see: Any grab bars or  handrails. First and last steps. Where the edge of each step is. Use tools that help you move around (mobility aids) if they are needed. These include: Canes. Walkers. Scooters. Crutches. Turn on the lights when you go into a dark area. Replace any light bulbs as soon as they burn out. Set up your furniture so you have a clear path. Avoid moving your furniture around. If any of your floors are uneven, fix them. If there are any pets around you, be aware of where they are. Review your medicines with your doctor. Some medicines can make you feel dizzy. This can increase your chance of falling. Ask your doctor what  other things that you can do to help prevent falls. This information is not intended to replace advice given to you by your health care provider. Make sure you discuss any questions you have with your health care provider. Document Released: 08/16/2009 Document Revised: 03/27/2016 Document Reviewed: 11/24/2014 Elsevier Interactive Patient Education  2017 [employer].

## 2024-10-23 ENCOUNTER — Other Ambulatory Visit: Payer: Self-pay | Admitting: Family Medicine

## 2024-10-27 ENCOUNTER — Other Ambulatory Visit: Payer: Self-pay | Admitting: Family Medicine

## 2024-10-31 ENCOUNTER — Encounter: Payer: Self-pay | Admitting: Pharmacist

## 2024-10-31 ENCOUNTER — Other Ambulatory Visit: Payer: Self-pay | Admitting: Family Medicine

## 2024-10-31 NOTE — Telephone Encounter (Signed)
 Copied from CRM [id number]. Topic: Clinical - Medication Refill >> Oct 31, 2024  2:27 PM Gloria [name] wrote: Medication: losartan -hydrochlorothiazide  (HYZAAR) 100-25 MG tablet  Has the patient contacted their pharmacy? Yes (Agent: If no, request that the patient contact the pharmacy for the refill. If patient does not wish to contact the pharmacy document the reason why and proceed with request.) (Agent: If yes, when and what did the pharmacy advise?)  This is the patient's preferred pharmacy:  [id number] - [place], [place] - [address] [place] [address] [place] [address] [place] [address] Phone: [phone] Fax: [phone]  Is this the correct pharmacy for this prescription? Yes If no, delete pharmacy and type the correct one.   Has the prescription been filled recently? No  Is the patient out of the medication? Yes  Has the patient been seen for an appointment in the last year OR does the patient have an upcoming appointment? Yes  Can we respond through MyChart? Yes  Agent: Please be advised that Rx refills may take up to 3 business days. We ask that you follow-up with your pharmacy.

## 2024-10-31 NOTE — Progress Notes (Signed)
 Pharmacy Quality Measure Review  This patient is appearing on a report for being at risk of failing the adherence measure for hypertension (ACEi/ARB) medications this calendar year.   Medication: losartan -HCT Last fill date: 06/30/2024 for 90 day supply per adherence report.   Reviewed recent refill history in Dr Annemarie database. Actual last refill date was 10/01/2024 for 90 day supply. Patient has no refills remaining. Next appointment with PCP is past due and no appointment has been scheduled.   Patient also was noted to have elevated cholesterol at office visit in February 2025. She has atorvastatin  on her medication list but has not filled in 2025.   Lab Results  Component Value Date   CHOL 279 (H) 12/18/2023   HDL 55.10 12/18/2023   LDLCALC 185 (H) 12/18/2023   LDLDIRECT 173.0 04/07/2022   TRIG 193.0 (H) 12/18/2023   CHOLHDL 5 12/18/2023     Tried to call patient to discuss cholesterol medication and also set up appointment for follow up with PCP but was unable to reach patient. Sent MyChart message about contacting office to make appointment.   Madelin Ray, PharmD Clinical Pharmacist [facility]  Population Health [phone]

## 2024-11-01 ENCOUNTER — Other Ambulatory Visit: Payer: Self-pay | Admitting: Family Medicine

## 2024-11-01 ENCOUNTER — Other Ambulatory Visit: Payer: Self-pay

## 2024-11-01 MED ORDER — LOSARTAN POTASSIUM-HCTZ 100-25 MG PO TABS: 1.0000 | ORAL_TABLET | Freq: Every day | ORAL | 0 refills | Status: DC

## 2024-11-01 NOTE — Telephone Encounter (Signed)
 Pt has appt tomorrow for refill.

## 2024-11-01 NOTE — Telephone Encounter (Signed)
 Communication  Reason for CRM: Patient was currently at pharmacy trying to pick up her losartan -hydrochlorothiazide  (HYZAAR) 100-25 MG tablet and it was refused. Refusal reason patient needs an appointment. Tried multiple times to reiterate why this appointment is needed for patient to get refills, offered to get her scheduled but she had to hang up. Patient stated she would like a bridge-fill of this medication as she is completely out.    Pt came into office requesting refill. Pt was advised she was overdue for an appointment and pt was scheduled. Short term supply was sent to last pt until appt that was scheduled for 12/31.

## 2024-11-02 ENCOUNTER — Ambulatory Visit (INDEPENDENT_AMBULATORY_CARE_PROVIDER_SITE_OTHER): Admitting: Family Medicine

## 2024-11-02 ENCOUNTER — Encounter: Payer: Self-pay | Admitting: Family Medicine

## 2024-11-02 VITALS — BP 138/80 | HR 71 | Temp 98.1°F | Wt 158.0 lb

## 2024-11-02 DIAGNOSIS — E785 Hyperlipidemia, unspecified: Secondary | ICD-10-CM

## 2024-11-02 DIAGNOSIS — Z79899 Other long term (current) drug therapy: Secondary | ICD-10-CM | POA: Diagnosis not present

## 2024-11-02 DIAGNOSIS — M8589 Other specified disorders of bone density and structure, multiple sites: Secondary | ICD-10-CM

## 2024-11-02 DIAGNOSIS — E559 Vitamin D deficiency, unspecified: Secondary | ICD-10-CM

## 2024-11-02 DIAGNOSIS — I10 Essential (primary) hypertension: Secondary | ICD-10-CM

## 2024-11-02 DIAGNOSIS — R7303 Prediabetes: Secondary | ICD-10-CM

## 2024-11-02 DIAGNOSIS — Z23 Encounter for immunization: Secondary | ICD-10-CM | POA: Diagnosis not present

## 2024-11-02 DIAGNOSIS — E669 Obesity, unspecified: Secondary | ICD-10-CM

## 2024-11-02 MED ORDER — LOSARTAN POTASSIUM-HCTZ 100-25 MG PO TABS: 1.0000 | ORAL_TABLET | Freq: Every day | ORAL | 0 refills | Status: AC

## 2024-11-02 MED ORDER — AMLODIPINE BESYLATE 2.5 MG PO TABS: 2.5000 mg | ORAL_TABLET | Freq: Every day | ORAL | 0 refills | Status: AC

## 2024-11-02 NOTE — Progress Notes (Signed)
 "    Patient ID: Gloria [name], female  DOB: Nov 22, 1955, 68 y.o.   MRN: [id number] Patient Care Team    Relationship Specialty Notifications Start End  Catherine Charlies LABOR, DO PCP - General Family Medicine  08/20/15   Specialists, [facility]  Gastroenterology  09/30/21     Chief Complaint  Patient presents with   Hypertension    Pt is not taking Amlodipine  or Atorvastatin .      Subjective: Gloria [name] is a 68 y.o.  Female  present for Chronic Conditions/illness Management All past medical history, surgical history, allergies, family history, immunizations, medications and social history were updated in the electronic medical record today. All recent labs, ED visits and hospitalizations within the last year were reviewed.  Hypertension/HLD/obesity:  Pt reports compliance w/ losartan  -HCTZ 100-25 , and states she take it at night. She is NOT taking the amlodipine  that was added last visit. She is NOT taking Lipitor. Her husband is with her today and states it is a fish farm manager and he does not feel its needed.  Patient denies chest pain, shortness of breath, dizziness or lower extremity edema.  Pt takes a daily baby ASA.  She is prescribed statin.  Diet: Tries to follow a low-salt, low saturated, low sugar diet. Exercise: Does not exercise routinely RF: Hypertension, hyperlipidemia, obesity, history of heart disease in her father.  Diabetes: She has modified her diet, she cut back on sweets.  She has lost weight.   A1c has been in the prediabetic range and not required medication.     11/02/2024    9:35 AM 10/05/2024    3:22 PM 06/29/2024   11:28 AM 12/18/2023    8:11 AM 07/29/2023    3:49 PM  Depression screen PHQ 2/9  Decreased Interest 0 0 0 0 0  Down, Depressed, Hopeless 0 0 0 0 0  PHQ - 2 Score 0 0 0 0 0  Altered sleeping 0 0  1   Tired, decreased energy 0 0  0   Change in appetite 0 0  0   Feeling bad or failure about yourself  0 0  0   Trouble concentrating 0 0  0    Moving slowly or fidgety/restless 0 0  0   Suicidal thoughts 0 0  0   PHQ-9 Score 0 0  1    Difficult doing work/chores Not difficult at all Not difficult at all  Not difficult at all      Data saved with a previous flowsheet row definition      11/02/2024    9:36 AM 12/18/2023    8:12 AM  GAD 7 : Generalized Anxiety Score  Nervous, Anxious, on Edge 0 0  Control/stop worrying 0 0  Worry too much - different things 0 0  Trouble relaxing 0 0  Restless 0 0  Easily annoyed or irritable 0 0  Afraid - awful might happen 0 0  Total GAD 7 Score 0 0  Anxiety Difficulty Not difficult at all Not difficult at all    Immunization History  Administered Date(s) Administered   Influenza,inj,quad, With Preservative 08/24/2019   Influenza-Unspecified 10/03/2014, 09/04/2015, 08/17/2018, 08/04/2023   Td 11/04/2007   Tdap 09/24/2018   Zoster Recombinant(Shingrix ) 09/24/2018, 12/06/2018    Past Medical History:  Diagnosis Date   Allergy    High cholesterol    Hypertension    Nipple dermatitis 01/18/2016   Positive colorectal cancer screening using Cologuard test 11/15/2020   colonoscopy after at  [facility]- 5 yr f/u   Seasonal allergies    No Known Allergies Past Surgical History:  Procedure Laterality Date   INNER EAR SURGERY     NO PAST SURGERIES     Family History  Problem Relation Age of Onset   Heart disease Father    Alcohol abuse Brother    Kidney disease Brother    Breast cancer Neg Hx    Social History   Social History Narrative   Ms Miguez is married.  She lives with husband & adopted daughter. She works FT at [employer].   Smoke detector in the home, wears her seatbelt.     Allergies as of 11/02/2024   No Known Allergies      Medication List        Accurate as of November 02, 2024  9:59 AM. If you have any questions, ask your nurse or doctor.          STOP taking these medications    ondansetron  4 MG disintegrating  tablet Commonly known as: ZOFRAN -ODT Stopped by: Gloria Benge, DO       TAKE these medications    amLODipine  2.5 MG tablet Commonly known as: NORVASC  Take 1 tablet (2.5 mg total) by mouth daily.   aspirin 81 MG chewable tablet Chew by mouth daily. One tablet every other day   atorvastatin  80 MG tablet Commonly known as: LIPITOR Take 1 tablet (80 mg total) by mouth daily.   cholecalciferol 25 MCG (1000 UNIT) tablet Commonly known as: VITAMIN D3 Take 1,000 Units by mouth daily.   losartan -hydrochlorothiazide  100-25 MG tablet Commonly known as: HYZAAR Take 1 tablet by mouth daily.   triamcinolone  cream 0.1 % Commonly known as: KENALOG  Apply 1 Application topically 2 (two) times daily. When needed.        All past medical history, surgical history, allergies, family history, immunizations andmedications were updated in the EMR today and reviewed under the history and medication portions of their EMR.     No results found for this or any previous visit (from the past 2160 hours).    ROS: 14 pt review of systems performed and negative (unless mentioned in an HPI)  Objective: BP 138/80   Pulse 71   Temp 98.1 F (36.7 C)   Wt 158 lb (71.7 kg)   SpO2 98%   BMI 31.91 kg/m  Physical Exam Vitals and nursing note reviewed.  Constitutional:      General: She is not in acute distress.    Appearance: Normal appearance. She is not ill-appearing, toxic-appearing or diaphoretic.  HENT:     Head: Normocephalic and atraumatic.  Eyes:     General: No scleral icterus.       Right eye: No discharge.        Left eye: No discharge.     Extraocular Movements: Extraocular movements intact.     Conjunctiva/sclera: Conjunctivae normal.     Pupils: Pupils are equal, round, and reactive to light.  Cardiovascular:     Rate and Rhythm: Normal rate and regular rhythm.     Heart sounds: No murmur heard. Pulmonary:     Effort: Pulmonary effort is normal. No respiratory distress.      Breath sounds: Normal breath sounds. No wheezing, rhonchi or rales.  Musculoskeletal:     Right lower leg: No edema.     Left lower leg: No edema.  Skin:    General: Skin is warm.     Findings: No rash.  Neurological:     Mental Status: She is alert and oriented to person, place, and time. Mental status is at baseline.     Motor: No weakness.     Gait: Gait normal.  Psychiatric:        Mood and Affect: Mood normal.        Behavior: Behavior normal.        Thought Content: Thought content normal.        Judgment: Judgment normal.    No results found.  Assessment/plan: Gloria [name] is a 68 y.o. female present for chronic condition management Essential hypertension/HLD/obesity/noncompliance with med regimen.  Goal < 130/80 Stable Continue losartan  100-hydrochlorothiazide  25 mg every day  START amlodipine  2.5 mg qd Low sodium, heart healthy diet.  Routine exercise IS not interested in discussing and changes subject everytime attempts to introduce education- recommend atorvastatin  start> she has script if she decides to take.  LDL and LFTs collected today.  Prediabetes -Has been doing great watching her diet and losing weight. -Last A1c was 6.2.> 6.1< 5.8> 5.8 5.8>6.1> A1c collected today  Vitamin D  deficiency/Osteopenia of multiple sites - dexa > ordered bc with mam> not completed -Vitamin D  levels UTD-due next visit  Return in about 7 weeks (around 12/20/2024) for cpe (20 min), Routine chronic condition follow-up.  Orders Placed This Encounter  Procedures   Direct LDL   Hemoglobin A1c   Hepatic function panel   Meds ordered this encounter  Medications   amLODipine  (NORVASC ) 2.5 MG tablet    Sig: Take 1 tablet (2.5 mg total) by mouth daily.    Dispense:  90 tablet    Refill:  0   losartan -hydrochlorothiazide  (HYZAAR) 100-25 MG tablet    Sig: Take 1 tablet by mouth daily.    Dispense:  90 tablet    Refill:  0   Referral Orders  No referral(s) requested today      Electronically signed by: Charlies Bellini, DO [facility] [place]  "

## 2024-11-02 NOTE — Patient Instructions (Addendum)
 Return in about 7 weeks (around 12/20/2024) for cpe (20 min), Routine chronic condition follow-up.   In order for us  to continue management of your chronic conditions, you must be seen every 5-6 months for evaluation.  Medications are refilled during your office visit to last until the next appointment.  To keep you on file, we will need to see you back around 2-1/2 months for your physical, which was completed last February.  All of your preventative screenings will be ordered , fasting labs  will be collected on this date, chronic conditions will be managed, and refills will be provided.       Great to see you today.  I have refilled the medication(s) we provide.   If labs were collected or images ordered, we will inform you of  results once we have received them and reviewed. We will contact you either by echart message, or telephone call.  Please give ample time to the testing facility, and our office to run,  receive and review results. Please do not call inquiring of results, even if you can see them in your chart. We will contact you as soon as we are able. If it has been over 1 week since the test was completed, and you have not yet heard from us , then please call us .    - echart message- for normal results that have been seen by the patient already.   - telephone call: abnormal results or if patient has not viewed results in their echart.  If a referral to a specialist was entered for you, please call us  in 2 weeks if you have not heard from the specialist office to schedule.

## 2024-11-04 ENCOUNTER — Ambulatory Visit: Payer: Self-pay | Admitting: Family Medicine

## 2024-11-04 LAB — HEPATIC FUNCTION PANEL
AG Ratio: 1.6 (calc) (ref 1.0–2.5)
ALT: 21 U/L (ref 6–29)
AST: 19 U/L (ref 10–35)
Albumin: 4.6 g/dL (ref 3.6–5.1)
Alkaline phosphatase (APISO): 60 U/L (ref 37–153)
Bilirubin, Direct: 0.1 mg/dL (ref 0.0–0.2)
Globulin: 2.8 g/dL (ref 1.9–3.7)
Indirect Bilirubin: 0.6 mg/dL (ref 0.2–1.2)
Total Bilirubin: 0.7 mg/dL (ref 0.2–1.2)
Total Protein: 7.4 g/dL (ref 6.1–8.1)

## 2024-11-04 LAB — HEMOGLOBIN A1C
Hgb A1c MFr Bld: 6.2 % — ABNORMAL HIGH
Mean Plasma Glucose: 131 mg/dL
eAG (mmol/L): 7.3 mmol/L

## 2024-11-04 LAB — LDL CHOLESTEROL, DIRECT: Direct LDL: 168 mg/dL — ABNORMAL HIGH
